# Patient Record
Sex: Female | Born: 1965 | Race: White | Hispanic: No | Smoking: Current every day smoker
Health system: Southern US, Community
[De-identification: ages and names within clinical notes are randomized; demographics above are authoritative.]

## PROBLEM LIST (undated history)

## (undated) DIAGNOSIS — M797 Fibromyalgia: Secondary | ICD-10-CM

## (undated) DIAGNOSIS — R519 Headache, unspecified: Secondary | ICD-10-CM

## (undated) DIAGNOSIS — F32A Depression, unspecified: Secondary | ICD-10-CM

## (undated) DIAGNOSIS — R251 Tremor, unspecified: Secondary | ICD-10-CM

## (undated) DIAGNOSIS — K219 Gastro-esophageal reflux disease without esophagitis: Secondary | ICD-10-CM

## (undated) DIAGNOSIS — G4730 Sleep apnea, unspecified: Secondary | ICD-10-CM

## (undated) DIAGNOSIS — F1120 Opioid dependence, uncomplicated: Secondary | ICD-10-CM

## (undated) DIAGNOSIS — F329 Major depressive disorder, single episode, unspecified: Secondary | ICD-10-CM

## (undated) DIAGNOSIS — M542 Cervicalgia: Secondary | ICD-10-CM

## (undated) DIAGNOSIS — G8929 Other chronic pain: Secondary | ICD-10-CM

## (undated) DIAGNOSIS — F419 Anxiety disorder, unspecified: Secondary | ICD-10-CM

## (undated) HISTORY — DX: Anxiety disorder, unspecified: F41.9

## (undated) HISTORY — PX: GALLBLADDER SURGERY: SHX652

## (undated) HISTORY — DX: Fibromyalgia: M79.7

## (undated) HISTORY — DX: Other chronic pain: G89.29

## (undated) HISTORY — DX: Depression, unspecified: F32.A

## (undated) HISTORY — DX: Opioid dependence, uncomplicated: F11.20

## (undated) HISTORY — DX: Cervicalgia: M54.2

## (undated) HISTORY — PX: ROTATOR CUFF REPAIR: SHX139

## (undated) HISTORY — DX: Major depressive disorder, single episode, unspecified: F32.9

## (undated) HISTORY — DX: Tremor, unspecified: R25.1

---

## 2000-11-21 HISTORY — PX: OTHER SURGICAL HISTORY: SHX169

## 2007-07-17 ENCOUNTER — Encounter: Admission: RE | Admit: 2007-07-17 | Discharge: 2007-07-17 | Payer: Self-pay | Admitting: Obstetrics and Gynecology

## 2007-07-19 ENCOUNTER — Other Ambulatory Visit: Admission: RE | Admit: 2007-07-19 | Discharge: 2007-07-19 | Payer: Self-pay | Admitting: Unknown Physician Specialty

## 2008-01-29 ENCOUNTER — Encounter: Admission: RE | Admit: 2008-01-29 | Discharge: 2008-01-29 | Payer: Self-pay | Admitting: Otolaryngology

## 2008-11-21 HISTORY — PX: OTHER SURGICAL HISTORY: SHX169

## 2009-03-16 ENCOUNTER — Ambulatory Visit (HOSPITAL_COMMUNITY): Admission: RE | Admit: 2009-03-16 | Discharge: 2009-03-16 | Payer: Self-pay | Admitting: Neurological Surgery

## 2010-07-08 ENCOUNTER — Encounter: Admission: RE | Admit: 2010-07-08 | Discharge: 2010-07-08 | Payer: Self-pay | Admitting: Gastroenterology

## 2010-07-14 ENCOUNTER — Encounter: Admission: RE | Admit: 2010-07-14 | Discharge: 2010-07-14 | Payer: Self-pay | Admitting: Obstetrics and Gynecology

## 2010-07-28 ENCOUNTER — Ambulatory Visit (HOSPITAL_COMMUNITY): Admission: RE | Admit: 2010-07-28 | Discharge: 2010-07-28 | Payer: Self-pay | Admitting: Gastroenterology

## 2010-08-04 ENCOUNTER — Ambulatory Visit (HOSPITAL_COMMUNITY): Admission: RE | Admit: 2010-08-04 | Discharge: 2010-08-04 | Payer: Self-pay | Admitting: Gastroenterology

## 2010-09-29 ENCOUNTER — Ambulatory Visit (HOSPITAL_COMMUNITY): Admission: RE | Admit: 2010-09-29 | Discharge: 2010-09-29 | Payer: Self-pay | Admitting: Gastroenterology

## 2011-03-02 LAB — CBC
HCT: 38.6 % (ref 36.0–46.0)
Hemoglobin: 13.3 g/dL (ref 12.0–15.0)
MCHC: 34.4 g/dL (ref 30.0–36.0)
MCV: 90.1 fL (ref 78.0–100.0)
Platelets: 273 10*3/uL (ref 150–400)
RBC: 4.29 MIL/uL (ref 3.87–5.11)
RDW: 13.2 % (ref 11.5–15.5)
WBC: 7.9 10*3/uL (ref 4.0–10.5)

## 2011-04-05 NOTE — Op Note (Signed)
[name], COLLAR               ACCOUNT NO.:  [id number]   MEDICAL RECORD NO.:  [id number]          PATIENT TYPE:  OIB   LOCATION:  3526                         FACILITY:  [facility]   PHYSICIAN:  Stefani Dama, M.D.  DATE OF BIRTH:  11-04-1966   DATE OF PROCEDURE:  03/16/2009  DATE OF DISCHARGE:  03/16/2009                               OPERATIVE REPORT   PREOPERATIVE DIAGNOSIS:  Cervical radiculopathy with spondylosis at C5-  C6.   POSTOPERATIVE DIAGNOSIS:  Cervical radiculopathy with spondylosis at C5-  C6.   PROCEDURE:  Anterior cervical decompression at C5-C6, arthrodesis with  structural allograft and Alphatec plate fixation at C5-C6.   SURGEON:  Stefani Dama, MD   FIRST ASSISTANT:  Clydene Fake, MD   ANESTHESIA:  General endotracheal.   INDICATIONS:  Nicole Bartlett is a 45 year old individual who has had  significant neck, shoulder, and arm pain with weakness in the biceps and  grip on the right side.  She has evidence of advanced spondylitic  changes with a central disk herniation causing biforaminal stenosis at  C5-C6.  She was treated conservatively but having failed this and  developing increasing pain and weakness in the right arm she has been  advised regarding surgical intervention.   PROCEDURE:  The patient was brought to the operating room supine on the  stretcher.  After smooth induction of general endotracheal anesthesia,  she was placed in 5 pounds of halter traction.  The neck was prepped  with alcohol and DuraPrep and draped in a sterile fashion.  Transverse  incision was made in left side of the neck and this was carried down  through the platysma.  The plane between the sternocleidomastoid and  strap muscles were dissected bluntly until the prevertebral space was  reached, first identifiable disk space was noted to be that at C3-C4  with a needle placed as far cephalad as possible.  Then after dissecting  to the space considered the C5-C6 space a  second final localizing  radiograph was obtained with the shoulders being pulled identifying the  space positively.  Anterior portion of the disk space was opened with an  osteophytectomy tool as there was a large ventral osteophyte noted in  the opening of C5-C6.  Once the osteophyte was removed, the disk  material underneath this could be dissected with combination of Kerrison  rongeurs and the disk space was fully evacuated.  As the region of  posterior longitudinal ligament was reached, the ligament was opened to  the left then to the right side and dissection was carried out to the  lateral recesses.  Once the lateral recesses were decompressed,  osteophytes from the uncinate process on the right side was removed to  allow full decompression of the nerve root on that right side.  Once  this was performed, hemostasis in the prevertebral space was obtained  and the interspace was sized for an appropriate size spacer and it was  felt that an 8-mm transgraft would fit appropriately and this was shaved  and formed to the appropriate size and configuration to fit into  the  interspace.  This was placed and tamped appropriately and then a 14-mm  standard size Alphatec was fitted to the ventral aspect of the vertebral  body of C5-C6 after traction was removed and the neck in slight flexion.  14-mm variable angle screws were placed to secure the plate between C5  and C6.  Final radiograph identified the top portion of the construct to  be in good position with the bone graft centered in the interspace.  With  this then hemostasis in the soft tissues was obtained meticulously and  the platysma was closed with 3-0 Vicryl in interrupted fashion, 3-0  Vicryl was then used to close the subcuticular skin and Dermabond was  placed on the skin.  The patient tolerated the procedure well was  returned to recovery room in stable condition.      Stefani Dama, M.D.  Electronically Signed      HJE/MEDQ  D:  03/16/2009  T:  03/17/2009  Job:  [id number]

## 2011-09-26 ENCOUNTER — Other Ambulatory Visit: Payer: Self-pay | Admitting: Anesthesiology

## 2011-09-26 DIAGNOSIS — M25511 Pain in right shoulder: Secondary | ICD-10-CM

## 2011-09-27 ENCOUNTER — Other Ambulatory Visit: Payer: Self-pay

## 2012-08-15 DIAGNOSIS — N912 Amenorrhea, unspecified: Secondary | ICD-10-CM | POA: Insufficient documentation

## 2014-01-07 ENCOUNTER — Other Ambulatory Visit: Payer: Self-pay

## 2014-01-07 MED ORDER — TOPIRAMATE 25 MG PO TABS: 50.0000 mg | ORAL_TABLET | Freq: Two times a day (BID) | ORAL | Status: DC

## 2014-02-24 ENCOUNTER — Telehealth: Payer: Self-pay | Admitting: Nurse Practitioner

## 2014-02-24 MED ORDER — TOPIRAMATE 25 MG PO TABS: 50.0000 mg | ORAL_TABLET | Freq: Two times a day (BID) | ORAL | Status: DC

## 2014-02-24 NOTE — Telephone Encounter (Signed)
Rx has been sent  

## 2014-02-24 NOTE — Telephone Encounter (Signed)
Pt needs a refill on her topiramate (TOPAMAX) 25 MG tablet.  She has an appointment scheduled for 03-06-14.  Thank you.

## 2014-03-05 ENCOUNTER — Encounter: Payer: Self-pay | Admitting: Nurse Practitioner

## 2014-03-06 ENCOUNTER — Encounter: Payer: Self-pay | Admitting: Nurse Practitioner

## 2014-03-06 ENCOUNTER — Encounter (INDEPENDENT_AMBULATORY_CARE_PROVIDER_SITE_OTHER): Payer: Self-pay

## 2014-03-06 ENCOUNTER — Ambulatory Visit (INDEPENDENT_AMBULATORY_CARE_PROVIDER_SITE_OTHER): Payer: BC Managed Care – PPO | Admitting: Nurse Practitioner

## 2014-03-06 VITALS — BP 113/83 | HR 78 | Ht 65.0 in | Wt 215.0 lb

## 2014-03-06 DIAGNOSIS — G43019 Migraine without aura, intractable, without status migrainosus: Secondary | ICD-10-CM | POA: Insufficient documentation

## 2014-03-06 DIAGNOSIS — R51 Headache: Secondary | ICD-10-CM

## 2014-03-06 DIAGNOSIS — R519 Headache, unspecified: Secondary | ICD-10-CM | POA: Insufficient documentation

## 2014-03-06 MED ORDER — TOPIRAMATE 25 MG PO TABS: 50.0000 mg | ORAL_TABLET | Freq: Two times a day (BID) | ORAL | Status: DC

## 2014-03-06 MED ORDER — RIZATRIPTAN BENZOATE 10 MG PO TABS: 10.0000 mg | ORAL_TABLET | ORAL | Status: DC | PRN

## 2014-03-06 NOTE — Progress Notes (Signed)
[facility]  PATIENT: Nicole Bartlett DOB: May 19, 1966   REASON FOR VISIT: Followup for migraines   HISTORY OF PRESENT ILLNESS: Ms. Atchley, 48 year old female returns for followup she has a long history of headaches for more than 20 years. She is currently on Topamax 50 twice daily with less intense headaches. She takes Maxalt acutely. Overall her headaches have decreased by 50-75%. She also has a history of chronic pain and is seeing  pain management Dr. Hardin Negus. She returns for reevaluation. She has had no new medical problems since last seen.   HISTORY:  HAs occurring daily for almost 20 yrs- usually in back or front, dull, aching, not lateralizing by Dr. Doy Mince on 03/30/10.   She has taken APAP and ibuprofen for HAs in past.  Now on  Topamax 50mg  twice daily. Headaches less intense, throb, no nausea, light or noise sensitivity and have decreased by 50%. HAs more intense w/ menses- rarely would wake at night- no assoc visual sx, dizziness, numbness.h/o neck fusion x2 (C6-7 2002, C5-6 2010)- She goes to the pain clinic for chronic back pain. She takes Maxalt rarely but it works acutely.    REVIEW OF SYSTEMS: Full 14 system review of systems performed and notable only for those listed, all others are neg:  Constitutional: N/A  Cardiovascular: N/A  Ear/Nose/Throat: Ringing in the ears  Skin: N/A  Eyes: N/A  Respiratory: N/A  Gastroitestinal: N/A  Hematology/Lymphatic: N/A  Endocrine: N/A Musculoskeletal: Joint pain, back pain Allergy/Immunology: N/A  Neurological: Headache  Psychiatric: Anxiety Sleep frequent awakenings, sleep study in the past negative   ALLERGIES: No Known Allergies  HOME MEDICATIONS: Outpatient Prescriptions Prior to Visit  Medication Sig Dispense Refill  . buPROPion (WELLBUTRIN SR) 200 MG 12 hr tablet Take 200 mg by mouth 2 (two) times daily.      Marland Kitchen oxyCODONE (ROXICODONE) 15 MG immediate release tablet Take 10 mg by mouth every 4  (four) hours as needed for pain (as needed).       . rizatriptan (MAXALT) 10 MG tablet Take 10 mg by mouth as needed for migraine. May repeat in 2 hours if needed      . tiZANidine (ZANAFLEX) 4 MG capsule Take 4 mg by mouth 3 (three) times daily.      Marland Kitchen topiramate (TOPAMAX) 25 MG tablet Take 2 tablets (50 mg total) by mouth 2 (two) times daily.  120 tablet  0  . amitriptyline (ELAVIL) 10 MG tablet Take 10 mg by mouth at bedtime. 2 tabs at bedtime      . citalopram (CELEXA) 20 MG tablet Take 20 mg by mouth daily.      Marland Kitchen doxycycline (VIBRAMYCIN) 100 MG capsule Take 100 mg by mouth 2 (two) times daily.       No facility-administered medications prior to visit.    PAST MEDICAL HISTORY: Past Medical History  Diagnosis Date  . Anxiety and depression   . Fibromyalgia   . Chronic neck pain   . Narcotic dependence     PAST SURGICAL HISTORY: Past Surgical History  Procedure Laterality Date  . C5/c7 fusion  2002  . C5/c6 fusion  2010  . Rotator cuff repair Bilateral   . Gallbladder surgery    . Cesarean section      times 2    FAMILY HISTORY: Family History  Problem Relation Age of Onset  . Cancer Mother   . Diabetes Father   . Migraines Sister     SOCIAL HISTORY: History  Social History  . Marital Status: Legally Separated    Spouse Name: N/A    Number of Children: 2  . Years of Education: N/A   Occupational History  . [employer] History Main Topics  . Smoking status: Current Every Day Smoker  . Smokeless tobacco: Never Used  . Alcohol Use: Yes     Comment: 1 daily  . Drug Use: No  . Sexual Activity: Not on file   Other Topics Concern  . Not on file   Social History Narrative   Patient is separated.   Patient has 2 children   Patient lives at home with daughter.   Patient drinks caffeine everyday until 5pm.              PHYSICAL EXAM  Filed Vitals:   03/06/14 1106  BP: 113/83  Pulse: 78  Height: 5\' 5"  (1.651 m)  Weight: 215 lb  (97.523 kg)   Body mass index is 35.78 kg/(m^2).  Generalized: Well developed, obese female in no acute distress  Head: normocephalic and atraumatic,. Oropharynx benign  Neck: Supple, no carotid bruits  Cardiac: Regular rate rhythm, no murmur  Musculoskeletal: No deformity   Neurological examination   Mentation: Alert oriented to time, place, history taking. Follows all commands speech and language fluent  Cranial nerve II-XII: Pupils were equal round reactive to light extraocular movements were full, visual field were full on confrontational test. Facial sensation and strength were normal. hearing was intact to finger rubbing bilaterally. Uvula tongue midline. head turning and shoulder shrug were normal and symmetric.Tongue protrusion into cheek strength was normal. Motor: normal bulk and tone, full strength in the BUE, BLE, fine finger movements normal, no pronator drift. No focal weakness Sensory: normal and symmetric to light touch, pinprick, and  vibration  Coordination: finger-nose-finger, heel-to-shin bilaterally, no dysmetria Reflexes: Brachioradialis 2/2, biceps 2/2, triceps 2/2, patellar 2/2, Achilles 2/2, plantar responses were flexor bilaterally. Gait and Station: Rising up from seated position without assistance, normal stance,  moderate stride, good arm swing, smooth turning, able to perform tiptoe, and heel walking without difficulty. Tandem gait is steady  DIAGNOSTIC DATA (LABS, IMAGING, TESTING) - ASSESSMENT AND PLAN  48 y.o. year old female  has a past medical history of Anxiety and depression; Fibromyalgia; Chronic neck pain; and Narcotic dependence. She has had headaches for 20 years which are currently well controlled on Topamax.  Continue Topamax and Maxalt as ordered Patient was given written prescriptions her request Followup yearly and when necessary Dennie Bible, [facility], [facility], APRN  [facility] [address] [place], [place]  [address] [phone]

## 2014-03-06 NOTE — Patient Instructions (Signed)
Continue Topamax and Maxalt as ordered Patient was given written prescriptions her request Followup yearly and when necessary

## 2014-03-06 NOTE — Progress Notes (Signed)
I have read the note, and I agree with the clinical assessment and plan.  Nicole Bartlett K Nicole Bartlett   

## 2014-08-08 ENCOUNTER — Ambulatory Visit: Payer: Self-pay | Admitting: Medical

## 2014-09-22 DIAGNOSIS — F339 Major depressive disorder, recurrent, unspecified: Secondary | ICD-10-CM | POA: Insufficient documentation

## 2014-09-22 DIAGNOSIS — G894 Chronic pain syndrome: Secondary | ICD-10-CM | POA: Insufficient documentation

## 2015-02-06 DIAGNOSIS — Z720 Tobacco use: Secondary | ICD-10-CM | POA: Insufficient documentation

## 2015-02-06 DIAGNOSIS — G4710 Hypersomnia, unspecified: Secondary | ICD-10-CM | POA: Insufficient documentation

## 2015-02-06 DIAGNOSIS — E669 Obesity, unspecified: Secondary | ICD-10-CM | POA: Insufficient documentation

## 2015-02-06 DIAGNOSIS — R0683 Snoring: Secondary | ICD-10-CM | POA: Insufficient documentation

## 2015-02-06 DIAGNOSIS — R5383 Other fatigue: Secondary | ICD-10-CM | POA: Insufficient documentation

## 2015-03-09 ENCOUNTER — Ambulatory Visit: Payer: BC Managed Care – PPO | Admitting: Nurse Practitioner

## 2015-03-27 ENCOUNTER — Other Ambulatory Visit: Payer: Self-pay | Admitting: Anesthesiology

## 2015-03-27 ENCOUNTER — Other Ambulatory Visit: Payer: Self-pay

## 2015-03-27 DIAGNOSIS — M542 Cervicalgia: Secondary | ICD-10-CM

## 2015-03-27 MED ORDER — TOPIRAMATE 25 MG PO TABS: 50.0000 mg | ORAL_TABLET | Freq: Two times a day (BID) | ORAL | Status: DC

## 2015-03-27 NOTE — Telephone Encounter (Signed)
Patient has appt scheduled

## 2015-04-06 ENCOUNTER — Ambulatory Visit
Admission: RE | Admit: 2015-04-06 | Discharge: 2015-04-06 | Disposition: A | Discharge status: Home / Self Care | Payer: BLUE CROSS/BLUE SHIELD | Source: Ambulatory Visit | Attending: Anesthesiology | Admitting: Anesthesiology

## 2015-04-06 DIAGNOSIS — M542 Cervicalgia: Secondary | ICD-10-CM

## 2015-04-06 MED ORDER — GADOBENATE DIMEGLUMINE 529 MG/ML IV SOLN
20.0000 mL | Freq: Once | INTRAVENOUS | Status: AC | PRN
  Administered 2015-04-06: 20 mL via INTRAVENOUS

## 2015-04-08 ENCOUNTER — Encounter: Payer: Self-pay | Admitting: Nurse Practitioner

## 2015-04-08 ENCOUNTER — Ambulatory Visit (INDEPENDENT_AMBULATORY_CARE_PROVIDER_SITE_OTHER): Payer: BLUE CROSS/BLUE SHIELD | Admitting: Nurse Practitioner

## 2015-04-08 VITALS — BP 132/85 | HR 106 | Ht 64.0 in | Wt 228.0 lb

## 2015-04-08 DIAGNOSIS — R51 Headache: Secondary | ICD-10-CM

## 2015-04-08 DIAGNOSIS — G43019 Migraine without aura, intractable, without status migrainosus: Secondary | ICD-10-CM

## 2015-04-08 DIAGNOSIS — R519 Headache, unspecified: Secondary | ICD-10-CM

## 2015-04-08 MED ORDER — RIZATRIPTAN BENZOATE 10 MG PO TABS: 10.0000 mg | ORAL_TABLET | ORAL | Status: DC | PRN

## 2015-04-08 MED ORDER — TOPIRAMATE 25 MG PO TABS: 50.0000 mg | ORAL_TABLET | Freq: Two times a day (BID) | ORAL | Status: DC

## 2015-04-08 NOTE — Progress Notes (Signed)
[facility]  PATIENT: Nicole Bartlett DOB: 01/18/1966   REASON FOR VISIT: follow up for migraine HISTORY FROM:patient    HISTORY OF PRESENT ILLNESS:Nicole Bartlett, 49 year old female returns for followup, she has a long history of headaches for more than 21 years. She was last seen in the office 03/06/2014. Since that time she has been diagnosed with obstructive sleep apnea and is on BiPAP for a week now. She is also had increase in her neck pain and had recent MRI of the neck. She is to get those results this week. She is currently on Topamax 50 twice daily with less intense headaches. She takes Maxalt acutely. Overall her headaches have increased slightly however she thinks this is due to her neck pain. She also has a history of chronic pain and is seeing pain management Dr. Hardin Negus. She has major depression and sees Dr. Jordan Hawks, she has had numerous changes to her medications since last seen. She returns for reevaluation.  HISTORY: HAs occurring daily for almost 20 yrs- usually in back or front, dull, aching, not lateralizing by Dr. Doy Mince on 03/30/10. She has taken APAP and ibuprofen for HAs in past. Now on Topamax 50mg  twice daily. Headaches less intense, throb, no nausea, light or noise sensitivity and have decreased by 50%. HAs more intense w/ menses- rarely would wake at night- no assoc visual sx, dizziness, numbness.h/o neck fusion x2 (C6-7 2002, C5-6 2010)- She goes to the pain clinic for chronic back pain. She takes Maxalt rarely but it works acutely.    REVIEW OF SYSTEMS: Full 14 system review of systems performed and notable only for those listed, all others are neg:  Constitutional: Fatigue Cardiovascular: Leg swelling  Ear/Nose/Throat: neg  Skin: neg Eyes: neg Respiratory: neg Gastroitestinal: neg  Hematology/Lymphatic: Easy bruising Endocrine: neg Musculoskeletal: Back pain neck pain, neck stiffness  Allergy/Immunology: neg Neurological: Headache,  tremors  Psychiatric: Major depression, anxiety Sleep : Obstructive sleep apnea with BiPAP   ALLERGIES: Allergies  Allergen Reactions  . Bupropion Hives    Something in the compound drug.      HOME MEDICATIONS: Outpatient Prescriptions Prior to Visit  Medication Sig Dispense Refill  . buPROPion (WELLBUTRIN SR) 200 MG 12 hr tablet Take 200 mg by mouth 2 (two) times daily.    . OxyCODONE HCl ER (OXYCONTIN) 30 MG T12A Take 30 mg by mouth 2 (two) times daily.    . rizatriptan (MAXALT) 10 MG tablet Take 1 tablet (10 mg total) by mouth as needed for migraine. May repeat in 2 hours if needed 10 tablet 11  . topiramate (TOPAMAX) 25 MG tablet Take 2 tablets (50 mg total) by mouth 2 (two) times daily. 120 tablet 0  . oxyCODONE (ROXICODONE) 15 MG immediate release tablet Take 10 mg by mouth every 4 (four) hours as needed for pain (as needed).     Marland Kitchen tiZANidine (ZANAFLEX) 4 MG capsule Take 4 mg by mouth 3 (three) times daily.     No facility-administered medications prior to visit.    PAST MEDICAL HISTORY: Past Medical History  Diagnosis Date  . Anxiety and depression   . Fibromyalgia   . Chronic neck pain   . Narcotic dependence   . Tremor     PAST SURGICAL HISTORY: Past Surgical History  Procedure Laterality Date  . C5/c7 fusion  2002  . C5/c6 fusion  2010  . Rotator cuff repair Bilateral   . Gallbladder surgery    . Cesarean section  times 2    FAMILY HISTORY: Family History  Problem Relation Age of Onset  . Cancer Mother   . Diabetes Father   . Migraines Sister     SOCIAL HISTORY: History   Social History  . Marital Status: Legally Separated    Spouse Name: N/A  . Number of Children: 2  . Years of Education: N/A   Occupational History  . [employer] History Main Topics  . Smoking status: Current Every Day Smoker -- 1.00 packs/day    Types: Cigarettes  . Smokeless tobacco: Never Used  . Alcohol Use: 0.0 oz/week    0 Standard drinks or  equivalent per week     Comment: rare  . Drug Use: No  . Sexual Activity: Not on file   Other Topics Concern  . Not on file   Social History Narrative   Patient is separated.   Patient has 2 children   Patient lives at home with daughter.   Patient drinks caffeine everyday until 5pm.              PHYSICAL EXAM  Filed Vitals:   04/08/15 0759  BP: 132/85  Pulse: 106  Height: 5\' 4"  (1.626 m)  Weight: 228 lb (103.42 kg)   Body mass index is 39.12 kg/(m^2). Generalized: Well developed, obese female in no acute distress  Head: normocephalic and atraumatic,. Oropharynx benign  Neck: Supple, no carotid bruits , mild decreased range of motion left and right Cardiac: Regular rate rhythm, no murmur  Musculoskeletal: No deformity   Neurological examination   Mentation: Alert oriented to time, place, history taking. Follows all commands speech and language fluent  Cranial nerve II-XII: Pupils were equal round reactive to light extraocular movements were full, visual field were full on confrontational test. Facial sensation and strength were normal. hearing was intact to finger rubbing bilaterally. Uvula tongue midline. head turning and shoulder shrug were normal and symmetric.Tongue protrusion into cheek strength was normal. Motor: normal bulk and tone, full strength in the BUE, BLE, fine finger movements normal, no pronator drift. No focal weakness Coordination: finger-nose-finger, heel-to-shin bilaterally, no dysmetria Reflexes: Brachioradialis 2/2, biceps 2/2, triceps 2/2, patellar 2/2, Achilles 2/2, plantar responses were flexor bilaterally. Gait and Station: Rising up from seated position without assistance, normal stance, moderate stride, good arm swing, smooth turning, able to perform tiptoe, and heel walking without difficulty. Tandem gait is steady DIAGNOSTIC DATA (LABS, IMAGING, TESTING)  ASSESSMENT AND PLAN  49 y.o. year old female  has a past medical history of  Anxiety and depression; Fibromyalgia; Chronic neck pain; Narcotic dependence; and headaches for over 21 years.  Continue Topamax at current dose will refill Continue Maxalt at current dose will refill Follow-up in 6 months Dennie Bible, [facility], [facility], APRN  [facility] [address] [place], [place] [address] [phone]

## 2015-04-08 NOTE — Progress Notes (Signed)
I have read the note, and I agree with the clinical assessment and plan.  Nicole Bartlett,Nicole Bartlett   

## 2015-04-08 NOTE — Patient Instructions (Signed)
Continue Topamax at current dose will refill Continue Maxalt at current dose will refill Follow-up in 6 months

## 2015-05-08 ENCOUNTER — Other Ambulatory Visit: Payer: Self-pay | Admitting: Neurological Surgery

## 2015-05-08 DIAGNOSIS — R251 Tremor, unspecified: Secondary | ICD-10-CM

## 2015-05-16 ENCOUNTER — Ambulatory Visit
Admission: RE | Admit: 2015-05-16 | Discharge: 2015-05-16 | Disposition: A | Discharge status: Home / Self Care | Payer: BLUE CROSS/BLUE SHIELD | Source: Ambulatory Visit | Attending: Neurological Surgery | Admitting: Neurological Surgery

## 2015-05-16 DIAGNOSIS — R251 Tremor, unspecified: Secondary | ICD-10-CM

## 2015-07-01 ENCOUNTER — Ambulatory Visit (INDEPENDENT_AMBULATORY_CARE_PROVIDER_SITE_OTHER): Payer: BLUE CROSS/BLUE SHIELD | Admitting: Nurse Practitioner

## 2015-07-01 ENCOUNTER — Encounter: Payer: Self-pay | Admitting: Nurse Practitioner

## 2015-07-01 VITALS — BP 122/83 | HR 98 | Ht 63.5 in | Wt 232.0 lb

## 2015-07-01 DIAGNOSIS — G43019 Migraine without aura, intractable, without status migrainosus: Secondary | ICD-10-CM | POA: Diagnosis not present

## 2015-07-01 NOTE — Progress Notes (Signed)
[facility]  PATIENT: Nicole Bartlett DOB: 1966-07-15   REASON FOR VISIT: Follow-up for migraine history of neck pain and obstructive sleep apnea HISTORY FROM: Patient    HISTORY OF PRESENT ILLNESS:Nicole Bartlett, 49 year old female returns for followup, she has a long history of headaches for more than 21 years.  Since last seen  she has been diagnosed with obstructive sleep apnea and is on BiPAP for 2 months. She is also had increase in her neck pain and had recent MRI of the neck 04/07/15  with impression : Prior fusion C5-6 and C6-7 with mild reversal of the normal cervical lordosis.No significant cervical spinal stenosis or foraminal narrowing.She also had an MRI of the brain 05/16/15 ordered by Dr. Ellene Route which was normal . She is currently on Topamax 100mg  twice daily with less intense headaches. She takes Maxalt acutely. Overall her headaches have increased slightly however she thinks this is due to her neck pain.She has history of chronic pain and is seeing pain management Dr. Hardin Negus. She has major depression and sees Dr. Jordan Hawks. She returns for reevaluation.  HISTORY: HAs occurring daily for almost 20 yrs- usually in back or front, dull, aching, not lateralizing by Dr. Doy Mince on 03/30/10. She has taken APAP and ibuprofen for HAs in past. Now on Topamax 50mg  twice daily. Headaches less intense, throb, no nausea, light or noise sensitivity and have decreased by 50%. HAs more intense w/ menses- rarely would wake at night- no assoc visual sx, dizziness, numbness.h/o neck fusion x2 (C6-7 2002, C5-6 2010)- She goes to the pain clinic for chronic back pain. She takes Maxalt rarely but it works acutely.     REVIEW OF SYSTEMS: Full 14 system review of systems performed and notable only for those listed, all others are neg:  Constitutional: Fatigue Cardiovascular: Leg swelling Ear/Nose/Throat: neg  Skin: neg Eyes: Light sensitivity Respiratory: neg Gastroitestinal:  neg  Hematology/Lymphatic: neg  Endocrine: Intolerance to heat Musculoskeletal: Joint pain joint swelling, back pain aching muscles Pain neck stiffness seen by pain clinic Allergy/Immunology: neg Neurological: neg Psychiatric depression and anxiety seen by psychiatry Sleep : Obstructive sleep apnea, daytime drowsiness   ALLERGIES: Allergies  Allergen Reactions  . Bupropion Hives    Something in the compound drug.      HOME MEDICATIONS: Outpatient Prescriptions Prior to Visit  Medication Sig Dispense Refill  . buPROPion (WELLBUTRIN SR) 200 MG 12 hr tablet Take 200 mg by mouth 2 (two) times daily.    . Cholecalciferol (VITAMIN D) 2000 UNITS tablet Take 2,000 Units by mouth as needed.     . clonazePAM (KLONOPIN) 1 MG tablet Take 1 mg by mouth.    . diclofenac sodium (VOLTAREN) 1 % GEL PLACE 4 G ONTO THE SKIN 4 (FOUR) TIMES DAILY.    Marland Kitchen dicyclomine (BENTYL) 20 MG tablet   11  . DULoxetine (CYMBALTA) 60 MG capsule Take 60 mg by mouth.    . methocarbamol (ROBAXIN) 500 MG tablet Take 500 mg by mouth.    . Oxycodone HCl 10 MG TABS Take 1 tablet by mouth.    . pantoprazole (PROTONIX) 40 MG tablet Take 40 mg by mouth daily as needed.   5  . rizatriptan (MAXALT) 10 MG tablet Take 1 tablet (10 mg total) by mouth as needed for migraine. May repeat in 2 hours if needed 10 tablet 11  . topiramate (TOPAMAX) 25 MG tablet Take 2 tablets (50 mg total) by mouth 2 (two) times daily. 120 tablet 6  .  doxycycline (VIBRA-TABS) 100 MG tablet Take 100 mg by mouth daily.     . OxyCODONE HCl ER (OXYCONTIN) 30 MG T12A Take 30 mg by mouth 2 (two) times daily.     No facility-administered medications prior to visit.    PAST MEDICAL HISTORY: Past Medical History  Diagnosis Date  . Anxiety and depression   . Fibromyalgia   . Chronic neck pain   . Narcotic dependence   . Tremor     PAST SURGICAL HISTORY: Past Surgical History  Procedure Laterality Date  . C5/c7 fusion  2002  . C5/c6 fusion  2010  .  Rotator cuff repair Bilateral   . Gallbladder surgery    . Cesarean section      times 2    FAMILY HISTORY: Family History  Problem Relation Age of Onset  . Cancer Mother   . Diabetes Father   . Migraines Sister     SOCIAL HISTORY: Social History   Social History  . Marital Status: Legally Separated    Spouse Name: N/A  . Number of Children: 2  . Years of Education: N/A   Occupational History  . [employer] History Main Topics  . Smoking status: Current Every Day Smoker -- 1.00 packs/day    Types: Cigarettes  . Smokeless tobacco: Never Used  . Alcohol Use: 0.0 oz/week    0 Standard drinks or equivalent per week     Comment: rare  . Drug Use: No  . Sexual Activity: Not on file   Other Topics Concern  . Not on file   Social History Narrative   Patient is separated.   Patient has 2 children   Patient lives at home with daughter.   Patient drinks caffeine everyday until 5pm.              PHYSICAL EXAM  Filed Vitals:   07/01/15 0802  BP: 122/83  Pulse: 98  Height: 5' 3.5" (1.613 m)  Weight: 232 lb (105.235 kg)   Body mass index is 40.45 kg/(m^2). Generalized: Well developed, obese female in no acute distress  Head: normocephalic and atraumatic,. Oropharynx benign  Neck: Supple, no carotid bruits , mild decreased range of motion left and right Cardiac: Regular rate rhythm, no murmur  Musculoskeletal: No deformity   Neurological examination   Mentation: Alert oriented to time, place, history taking. Follows all commands speech and language fluent  Cranial nerve II-XII: Visual acuity 20/70 right, 20100 left with corrected lenses Pupils were equal round reactive to light extraocular movements were full, visual field were full on confrontational test. Facial sensation and strength were normal. hearing was intact to finger rubbing bilaterally. Uvula tongue midline. head turning and shoulder shrug were normal and symmetric.Tongue  protrusion into cheek strength was normal. Motor: normal bulk and tone, full strength in the BUE, BLE, fine finger movements normal, no pronator drift. No focal weakness Coordination: finger-nose-finger, heel-to-shin bilaterally, no dysmetria Reflexes: Brachioradialis 2/2, biceps 2/2, triceps 2/2, patellar 2/2, Achilles 2/2, plantar responses were flexor bilaterally. Gait and Station: Rising up from seated position without assistance, normal stance, moderate stride, good arm swing, smooth turning, able to perform tiptoe, and heel walking without difficulty. Tandem gait is steady  DIAGNOSTIC DATA (LABS, IMAGING, TESTING) 04/07/15 MRI of the neck with impression : Prior fusion C5-6 and C6-7 with mild reversal of the normal cervical lordosis.No significant cervical spinal stenosis or foraminal narrowing. 05/16/15 MRI of the brain ordered by Dr. Ellene Route  was normal- ASSESSMENT  AND PLAN  49 y.o. year old female  has a past medical history of Anxiety and depression; Fibromyalgia; Chronic neck pain; Narcotic dependence; and headaches for over 21 years. Essential tremor and obstructive sleep apnea here to follow-up.  Continue Topamax at current dose will refill Continue Maxalt at current dose will refill I spent additional 15 in total face to face time with the patient more than 50% of which was spent counseling and coordination of care, reviewing test results reviewing medications and discussing and reviewing the diagnosis of migraine , triggers, foods to avoid weather changes and other triggers.  Given a copy of common triggers If headaches worsen she is to keep a diary. Follow-up in 6 months Nicole Bartlett, [facility], [facility], APRN  [address] [facility] [address], [place], [place] [address] [phone]

## 2015-07-01 NOTE — Progress Notes (Signed)
I have read the note, and I agree with the clinical assessment and plan.  Corliss Coggeshall KEITH   

## 2015-07-01 NOTE — Patient Instructions (Signed)
Continue Topamax at current dose will refill Continue Maxalt at current dose will refill Discussed migraine triggers Follow-up in 6 months

## 2015-10-09 ENCOUNTER — Ambulatory Visit: Payer: BLUE CROSS/BLUE SHIELD | Admitting: Nurse Practitioner

## 2015-10-14 ENCOUNTER — Ambulatory Visit: Payer: BLUE CROSS/BLUE SHIELD | Admitting: Nurse Practitioner

## 2015-12-16 ENCOUNTER — Other Ambulatory Visit: Payer: Self-pay | Admitting: Nurse Practitioner

## 2015-12-16 NOTE — Telephone Encounter (Signed)
LMVM for pt to return call if needs medication refilled prior to her appt 01-06-16 otherwise I will disregard request.

## 2015-12-29 ENCOUNTER — Ambulatory Visit (INDEPENDENT_AMBULATORY_CARE_PROVIDER_SITE_OTHER): Payer: BLUE CROSS/BLUE SHIELD | Admitting: Nurse Practitioner

## 2015-12-29 ENCOUNTER — Encounter: Payer: Self-pay | Admitting: Nurse Practitioner

## 2015-12-29 VITALS — BP 122/78 | HR 100 | Ht 63.5 in | Wt 228.4 lb

## 2015-12-29 DIAGNOSIS — G894 Chronic pain syndrome: Secondary | ICD-10-CM | POA: Diagnosis not present

## 2015-12-29 DIAGNOSIS — R51 Headache: Secondary | ICD-10-CM

## 2015-12-29 DIAGNOSIS — G43909 Migraine, unspecified, not intractable, without status migrainosus: Secondary | ICD-10-CM

## 2015-12-29 DIAGNOSIS — R519 Headache, unspecified: Secondary | ICD-10-CM

## 2015-12-29 MED ORDER — RIZATRIPTAN BENZOATE 10 MG PO TABS: 10.0000 mg | ORAL_TABLET | ORAL | Status: DC | PRN

## 2015-12-29 MED ORDER — TOPIRAMATE 25 MG PO TABS: ORAL_TABLET | ORAL | Status: DC

## 2015-12-29 NOTE — Progress Notes (Signed)
I have read the note, and I agree with the clinical assessment and plan.  Kinsly Hild KEITH   

## 2015-12-29 NOTE — Progress Notes (Signed)
[facility]  PATIENT: Nicole Bartlett DOB: 1966/03/06   REASON FOR VISIT: Follow-up for chronic headache/migraine, obstructive sleep apnea HISTORY FROM: Patient    HISTORY OF PRESENT ILLNESS:[name], 50 year old female returns for followup, she has a long history of headaches for more than 22 years. She was diagnosed with obstructive sleep apnea and is on BiPAP her mask  does not fit and she has not followed up with her equipment company. Her sleep physician is in [place] . She has chronic neck pain and most recent MRI of the neck without significant cervical spinal stenosis or foraminal narrowing.She also had an MRI of the brain 05/16/15 ordered by Dr. Ellene Route which was normal . She is currently on Topamax 50mg  twice daily with increase in headaches during tax season. 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 She has history of chronic pain and is seeing pain management Dr. Hardin Negus. She has major depression and sees Dr. Jordan Hawks. She returns for reevaluation.  HISTORY: HAs occurring daily for almost 20 yrs- usually in back or front, dull, aching, not lateralizing by Dr. Doy Mince on 03/30/10. She has taken APAP and ibuprofen for HAs in past. Now on Topamax 50mg  twice daily. Headaches less intense, throb, no nausea, light or noise sensitivity and have decreased by 50%. HAs more intense w/ menses- rarely would wake at night- no assoc visual sx, dizziness, numbness.h/o neck fusion x2 (C6-7 2002, C5-6 2010)- She goes to the pain clinic for chronic back pain. She takes Maxalt rarely but it works acutely.    REVIEW OF SYSTEMS: Full 14 system review of systems performed and notable only for those listed, all others are neg:  Constitutional: Fatigue Cardiovascular: neg Ear/Nose/Throat: neg  Skin: neg Eyes: Light sensitivity Respiratory: neg Gastroitestinal: neg  Hematology/Lymphatic: neg  Endocrine: Intolerance to heat Musculoskeletal: Joint pain neck pain and back  pain Allergy/Immunology: neg Neurological: Headaches Psychiatric: neg Sleep : Obstructive sleep apnea not using BiPAP   ALLERGIES: Allergies  Allergen Reactions  . Bupropion Hives    Something in the compound drug.      HOME MEDICATIONS: Outpatient Prescriptions Prior to Visit  Medication Sig Dispense Refill  . buPROPion (WELLBUTRIN SR) 200 MG 12 hr tablet Take 200 mg by mouth 2 (two) times daily.    . clonazePAM (KLONOPIN) 1 MG tablet Take 1 mg by mouth.    . DULoxetine (CYMBALTA) 60 MG capsule Take 60 mg by mouth.    . methocarbamol (ROBAXIN) 500 MG tablet Take 500 mg by mouth 4 (four) times daily.     . Oxycodone HCl 10 MG TABS Take 1 tablet by mouth 3 (three) times daily.     . pantoprazole (PROTONIX) 40 MG tablet Take 40 mg by mouth daily as needed.   5  . rizatriptan (MAXALT) 10 MG tablet Take 1 tablet (10 mg total) by mouth as needed for migraine. May repeat in 2 hours if needed 10 tablet 11  . topiramate (TOPAMAX) 25 MG tablet TAKE 2 TABLETS (50 MG TOTAL) BY MOUTH 2 (TWO) TIMES DAILY. 120 tablet 0  . Cholecalciferol (VITAMIN D) 2000 UNITS tablet Take 2,000 Units by mouth as needed. Reported on 12/29/2015    . diclofenac sodium (VOLTAREN) 1 % GEL PLACE 4 G ONTO THE SKIN 4 (FOUR) TIMES DAILY.    Marland Kitchen dicyclomine (BENTYL) 20 MG tablet Reported on 12/29/2015  11  . doxycycline (VIBRA-TABS) 100 MG tablet Take 100 mg by mouth daily. Reported on 12/29/2015    .  morphine (MS CONTIN) 30 MG 12 hr tablet Take 30 mg by mouth every 12 (twelve) hours. Reported on 12/29/2015  0   No facility-administered medications prior to visit.    PAST MEDICAL HISTORY: Past Medical History  Diagnosis Date  . Anxiety and depression   . Fibromyalgia   . Chronic neck pain   . Narcotic dependence ([place])   . Tremor     PAST SURGICAL HISTORY: Past Surgical History  Procedure Laterality Date  . C5/c7 fusion  2002  . C5/c6 fusion  2010  . Rotator cuff repair Bilateral   . Gallbladder surgery    . Cesarean  section      times 2    FAMILY HISTORY: Family History  Problem Relation Age of Onset  . Cancer Mother   . Diabetes Father   . Migraines Sister     SOCIAL HISTORY: Social History   Social History  . Marital Status: Legally Separated    Spouse Name: N/A  . Number of Children: 2  . Years of Education: N/A   Occupational History  . [place] History Main Topics  . Smoking status: Current Every Day Smoker -- 1.00 packs/day    Types: Cigarettes  . Smokeless tobacco: Never Used  . Alcohol Use: 0.0 oz/week    0 Standard drinks or equivalent per week     Comment: rare  . Drug Use: No  . Sexual Activity: Not on file   Other Topics Concern  . Not on file   Social History Narrative   Patient is separated.   Patient has 2 children   Patient lives at home with daughter.   Patient drinks caffeine everyday until 5pm.              PHYSICAL EXAM  Filed Vitals:   12/29/15 0822  BP: 122/78  Pulse: 100  Height: 5' 3.5" (1.613 m)  Weight: 228 lb 6.4 oz (103.602 kg)   Body mass index is 39.82 kg/(m^2). Generalized: Well developed, obese female in no acute distress  Head: normocephalic and atraumatic,. Oropharynx benign  Neck: Supple, no carotid bruits , mild decreased range of motion left and right Cardiac: Regular rate rhythm, no murmur  Musculoskeletal: No deformity   Neurological examination   Mentation: Alert oriented to time, place, history taking. Follows all commands speech and language fluent  Cranial nerve II-XII: Pupils were equal round reactive to light extraocular movements were full, visual field were full on confrontational test. Facial sensation and strength were normal. hearing was intact to finger rubbing bilaterally. Uvula tongue midline. head turning and shoulder shrug were normal and symmetric.Tongue protrusion into cheek strength was normal. Motor: normal bulk and tone, full strength in the BUE, BLE, fine finger movements  normal, no pronator drift. No focal weakness Coordination: finger-nose-finger, heel-to-shin bilaterally, no dysmetria Reflexes: Brachioradialis 2/2, biceps 2/2, triceps 2/2, patellar 2/2, Achilles 2/2, plantar responses were flexor bilaterally. Gait and Station: Rising up from seated position without assistance, normal stance, moderate stride, good arm swing, smooth turning, able to perform tiptoe, and heel walking without difficulty. Tandem gait is steady  DIAGNOSTIC DATA (LABS, IMAGING, TESTING) 04/07/15 MRI of the neck with impression : Prior fusion C5-6 and C6-7 with mild reversal of the normal cervical lordosis.No significant cervical spinal stenosis or foraminal narrowing. 05/16/15 MRI of the brain ordered by Dr. Ellene Route was normal-  -ASSESSMENT AND PLAN 50 y.o. year old female has a past medical history of Anxiety and depression; Fibromyalgia; Chronic  neck pain; Narcotic dependence; and headaches for over 21 years. Essential tremor and obstructive sleep apnea here to follow-up.  Increase Topamax to 75 mg twice daily will refill Continue Maxalt at current dose refill Continue follow-up with Dr. Silvio Pate and Dr. Hardin Negus Need to  follow-up with equipment company for BiPAP mask fitting Reviewed migraine triggers Follow-up in 6 months Dennie Bible, [facility], [facility], [facility] [address] [place], [place] [address] [phone]

## 2015-12-29 NOTE — Patient Instructions (Signed)
Increase Topamax to 75 mg twice daily will refill Continue Maxalt at current dose refill Continue follow-up with Dr. Silvio Pate and Dr. Hardin Negus Follow-up in 6 months

## 2016-01-06 ENCOUNTER — Ambulatory Visit: Payer: BLUE CROSS/BLUE SHIELD | Admitting: Nurse Practitioner

## 2016-01-14 ENCOUNTER — Other Ambulatory Visit: Payer: Self-pay | Admitting: Nurse Practitioner

## 2016-01-26 ENCOUNTER — Other Ambulatory Visit: Payer: Self-pay | Admitting: Physician Assistant

## 2016-01-26 DIAGNOSIS — R1013 Epigastric pain: Secondary | ICD-10-CM

## 2016-01-26 DIAGNOSIS — R7989 Other specified abnormal findings of blood chemistry: Secondary | ICD-10-CM

## 2016-01-26 DIAGNOSIS — R1011 Right upper quadrant pain: Secondary | ICD-10-CM

## 2016-01-26 DIAGNOSIS — R945 Abnormal results of liver function studies: Secondary | ICD-10-CM

## 2016-02-04 ENCOUNTER — Ambulatory Visit
Admission: RE | Admit: 2016-02-04 | Discharge: 2016-02-04 | Disposition: A | Discharge status: Home / Self Care | Payer: BLUE CROSS/BLUE SHIELD | Source: Ambulatory Visit | Attending: Physician Assistant | Admitting: Physician Assistant

## 2016-02-04 DIAGNOSIS — R945 Abnormal results of liver function studies: Secondary | ICD-10-CM

## 2016-02-04 DIAGNOSIS — R1011 Right upper quadrant pain: Secondary | ICD-10-CM

## 2016-02-04 DIAGNOSIS — R1013 Epigastric pain: Secondary | ICD-10-CM

## 2016-02-04 DIAGNOSIS — R7989 Other specified abnormal findings of blood chemistry: Secondary | ICD-10-CM

## 2016-02-05 ENCOUNTER — Other Ambulatory Visit: Payer: Self-pay | Admitting: Internal Medicine

## 2016-02-05 DIAGNOSIS — R7989 Other specified abnormal findings of blood chemistry: Secondary | ICD-10-CM

## 2016-02-05 DIAGNOSIS — R109 Unspecified abdominal pain: Secondary | ICD-10-CM

## 2016-02-05 DIAGNOSIS — R9389 Abnormal findings on diagnostic imaging of other specified body structures: Secondary | ICD-10-CM

## 2016-02-05 DIAGNOSIS — R945 Abnormal results of liver function studies: Principal | ICD-10-CM

## 2016-02-15 ENCOUNTER — Ambulatory Visit
Admission: RE | Admit: 2016-02-15 | Discharge: 2016-02-15 | Disposition: A | Discharge status: Home / Self Care | Payer: BLUE CROSS/BLUE SHIELD | Source: Ambulatory Visit | Attending: Internal Medicine | Admitting: Internal Medicine

## 2016-02-15 DIAGNOSIS — R109 Unspecified abdominal pain: Secondary | ICD-10-CM

## 2016-02-15 DIAGNOSIS — R9389 Abnormal findings on diagnostic imaging of other specified body structures: Secondary | ICD-10-CM

## 2016-02-15 DIAGNOSIS — R7989 Other specified abnormal findings of blood chemistry: Secondary | ICD-10-CM

## 2016-02-15 DIAGNOSIS — R945 Abnormal results of liver function studies: Principal | ICD-10-CM

## 2016-04-25 ENCOUNTER — Telehealth: Payer: Self-pay | Admitting: *Deleted

## 2016-04-25 MED ORDER — TOPIRAMATE 25 MG PO TABS: ORAL_TABLET | ORAL | Status: DC

## 2016-04-25 NOTE — Telephone Encounter (Signed)
Pt called, has question about topiramate (TOPAMAX) 25 MG tablet requesting 180 pills, pickup 150 pills from drug store.

## 2016-04-25 NOTE — Telephone Encounter (Signed)
I spoke to patient and verified tha tshe should be getting 180tabs. I advised her that I will send new Rx in, confirmed pharmacy.

## 2016-05-27 ENCOUNTER — Other Ambulatory Visit: Payer: Self-pay | Admitting: Nurse Practitioner

## 2016-06-07 ENCOUNTER — Other Ambulatory Visit: Payer: Self-pay | Admitting: Nurse Practitioner

## 2016-06-21 DIAGNOSIS — E559 Vitamin D deficiency, unspecified: Secondary | ICD-10-CM | POA: Insufficient documentation

## 2016-06-21 DIAGNOSIS — E785 Hyperlipidemia, unspecified: Secondary | ICD-10-CM | POA: Insufficient documentation

## 2016-06-21 DIAGNOSIS — Z79899 Other long term (current) drug therapy: Secondary | ICD-10-CM | POA: Insufficient documentation

## 2016-06-29 ENCOUNTER — Ambulatory Visit: Payer: BLUE CROSS/BLUE SHIELD | Admitting: Nurse Practitioner

## 2016-07-14 ENCOUNTER — Encounter: Payer: Self-pay | Admitting: Nurse Practitioner

## 2016-07-14 ENCOUNTER — Ambulatory Visit (INDEPENDENT_AMBULATORY_CARE_PROVIDER_SITE_OTHER): Payer: BLUE CROSS/BLUE SHIELD | Admitting: Nurse Practitioner

## 2016-07-14 VITALS — BP 125/82 | HR 91 | Ht 63.5 in | Wt 233.0 lb

## 2016-07-14 DIAGNOSIS — R51 Headache: Secondary | ICD-10-CM

## 2016-07-14 DIAGNOSIS — R519 Headache, unspecified: Secondary | ICD-10-CM

## 2016-07-14 DIAGNOSIS — G43909 Migraine, unspecified, not intractable, without status migrainosus: Secondary | ICD-10-CM | POA: Diagnosis not present

## 2016-07-14 MED ORDER — TOPIRAMATE 25 MG PO TABS: 50.0000 mg | ORAL_TABLET | Freq: Two times a day (BID) | ORAL | 6 refills | Status: DC

## 2016-07-14 MED ORDER — RIZATRIPTAN BENZOATE 10 MG PO TABS: ORAL_TABLET | ORAL | 6 refills | Status: DC

## 2016-07-14 NOTE — Patient Instructions (Signed)
Continue  Topamax 50mg   twice daily will refill Continue Maxalt at current dose refill Continue follow-up with Dr. Silvio Pate and Dr. Hardin Negus Follow-up in 6 months

## 2016-07-14 NOTE — Progress Notes (Signed)
[facility]  PATIENT: Nicole Bartlett DOB: 12/15/1965   REASON FOR VISIT: Follow-up for chronic headache/migraine, obstructive sleep apnea HISTORY FROM: Patient    HISTORY OF PRESENT ILLNESS:Nicole Bartlett, 50 year old female returns for followup, she has a long history of headaches for more than 23 years. She was diagnosed with obstructive sleep apnea and is on BiPAP. Her sleep physician is in [place] . She has chronic neck pain and most recent MRI of the neck without significant cervical spinal stenosis or foraminal narrowing.She also had an MRI of the brain 05/16/15 ordered by Dr. Ellene Route which was normal . She is currently on Topamax 50mg  twice daily with good control of headaches. Her dose was  increased to 75mg  BID after her last visit but felt she had more difficulty focusing.  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 She has history of chronic pain and is seeing pain management Dr. Hardin Negus. She has major depression and sees Dr. Jordan Hawks. She returns for reevaluation.  HISTORY: HAs occurring daily for almost 20 yrs- usually in back or front, dull, aching, not lateralizing by Dr. Doy Mince on 03/30/10. She has taken APAP and ibuprofen for HAs in past. Now on Topamax 50mg  twice daily. Headaches less intense, throb, no nausea, light or noise sensitivity and have decreased by 50%. HAs more intense w/ menses- rarely would wake at night- no assoc visual sx, dizziness, numbness.h/o neck fusion x2 (C6-7 2002, C5-6 2010)- She goes to the pain clinic for chronic back pain. She takes Maxalt rarely but it works acutely.    REVIEW OF SYSTEMS: Full 14 system review of systems performed and notable only for those listed, all others are neg:  Constitutional:  Cardiovascular: neg Ear/Nose/Throat: neg  Skin: neg Eyes: Light sensitivity Respiratory: neg Gastroitestinal: neg  Hematology/Lymphatic: neg  Endocrine: Intolerance to heat Musculoskeletal: Joint pain neck pain and  back pain manageed of pain clinic Allergy/Immunology: neg Neurological: Headaches Psychiatric: Depression managed by psych Sleep : Obstructive sleep apnea with  BiPAP   ALLERGIES: Allergies  Allergen Reactions  . Bupropion Hives    Something in the compound drug.      HOME MEDICATIONS: Outpatient Medications Prior to Visit  Medication Sig Dispense Refill  . buPROPion (WELLBUTRIN SR) 200 MG 12 hr tablet Take 200 mg by mouth 2 (two) times daily.    . clonazePAM (KLONOPIN) 1 MG tablet Take 1 mg by mouth.    . DULoxetine (CYMBALTA) 60 MG capsule Take 60 mg by mouth.    . methocarbamol (ROBAXIN) 500 MG tablet Take 500 mg by mouth 4 (four) times daily.     . Oxycodone HCl 10 MG TABS Take 1 tablet by mouth 3 (three) times daily.     . OXYCONTIN 20 MG 12 hr tablet Take 20 mg by mouth every 12 (twelve) hours.  0  . pantoprazole (PROTONIX) 40 MG tablet Take 40 mg by mouth daily as needed.   5  . PENNSAID 2 % SOLN 2 PUMPS TWICE DAILY AS NEEDED  0  . rizatriptan (MAXALT) 10 MG tablet TAKE 1 TABLET BY MOUTH AS NEEDED FOR MIGRAINE. MAY REPEAT IN 2 HOURS 10 tablet 0  . topiramate (TOPAMAX) 25 MG tablet TAKE 3 TABLETS (75 MG TOTAL) BY MOUTH 2 (TWO) TIMES DAILY. 180 tablet 5  . VENTOLIN HFA 108 (90 Base) MCG/ACT inhaler INHALE 2 PUFF EVERY FOUR TO SIX HOURS AS NEEDED FOR WHEEZING, SHORTNESS OF BREATH AND COUGHING  0   No facility-administered medications prior to  visit.     PAST MEDICAL HISTORY: Past Medical History:  Diagnosis Date  . Anxiety and depression   . Chronic neck pain   . Fibromyalgia   . Narcotic dependence ([place])   . Tremor     PAST SURGICAL HISTORY: Past Surgical History:  Procedure Laterality Date  . C5/C6 fusion  2010  . C5/C7 fusion  2002  . CESAREAN SECTION     times 2  . GALLBLADDER SURGERY    . ROTATOR CUFF REPAIR Bilateral     FAMILY HISTORY: Family History  Problem Relation Age of Onset  . Cancer Mother   . Diabetes Father   . Migraines Sister      SOCIAL HISTORY: Social History   Social History  . Marital status: Legally Separated    Spouse name: N/A  . Number of children: 2  . Years of education: N/A   Occupational History  . [employer] History Main Topics  . Smoking status: Current Every Day Smoker    Packs/day: 1.00    Types: Cigarettes  . Smokeless tobacco: Never Used  . Alcohol use 0.0 oz/week     Comment: rare  . Drug use: No  . Sexual activity: Not on file   Other Topics Concern  . Not on file   Social History Narrative   Patient is separated.   Patient has 2 children   Patient lives at home with daughter.   Patient drinks caffeine everyday until 5pm.              PHYSICAL EXAM  Vitals:   07/14/16 0802  BP: 125/82  Pulse: 91  Weight: 233 lb (105.7 kg)  Height: 5' 3.5" (1.613 m)   Body mass index is 40.63 kg/m. Generalized: Well developed, obese female in no acute distress  Head: normocephalic and atraumatic,. Oropharynx benign  Neck: Supple, no carotid bruits , mild decreased range of motion left and right Cardiac: Regular rate rhythm, no murmur  Musculoskeletal: No deformity   Neurological examination   Mentation: Alert oriented to time, place, history taking. Follows all commands speech and language fluent  Cranial nerve II-XII: Pupils were equal round reactive to light extraocular movements were full, visual field were full on confrontational test. Facial sensation and strength were normal. hearing was intact to finger rubbing bilaterally. Uvula tongue midline. head turning and shoulder shrug were normal and symmetric.Tongue protrusion into cheek strength was normal. Motor: normal bulk and tone, full strength in the BUE, BLE, fine finger movements normal, no pronator drift. No focal weakness Coordination: finger-nose-finger, heel-to-shin bilaterally, no dysmetria Reflexes: Brachioradialis 2/2, biceps 2/2, triceps 2/2, patellar 2/2, Achilles 2/2, plantar responses  were flexor bilaterally. Gait and Station: Rising up from seated position without assistance, normal stance, moderate stride, good arm swing, smooth turning, able to perform tiptoe, and heel walking without difficulty. Tandem gait is steady  DIAGNOSTIC DATA (LABS, IMAGING, TESTING) 04/07/15 MRI of the neck with impression : Prior fusion C5-6 and C6-7 with mild reversal of the normal cervical lordosis.No significant cervical spinal stenosis or foraminal narrowing. 05/16/15 MRI of the brain ordered by Dr. Ellene Route was normal-  -ASSESSMENT AND PLAN 50 y.o. year old female has a past medical history of Anxiety and depression; Fibromyalgia; Chronic neck pain; Narcotic dependence; and headaches for over 23 years, essential tremor and obstructive sleep apnea here to follow-up.  Continue  Topamax 50mg   twice daily will refill Continue Maxalt at current dose refill Continue follow-up with Dr. Silvio Pate  and Dr. Hardin Negus Call for worsening of headaches Follow-up in 6 months Dennie Bible, [facility] [place], [facility], [facility] [address] [place], [place] [address] [phone]

## 2016-07-14 NOTE — Progress Notes (Signed)
I have read the note, and I agree with the clinical assessment and plan.  Nicole Bartlett   

## 2016-07-15 DIAGNOSIS — R7303 Prediabetes: Secondary | ICD-10-CM | POA: Insufficient documentation

## 2016-08-04 ENCOUNTER — Other Ambulatory Visit: Payer: Self-pay | Admitting: Nurse Practitioner

## 2017-01-16 ENCOUNTER — Encounter: Payer: Self-pay | Admitting: Nurse Practitioner

## 2017-01-16 ENCOUNTER — Ambulatory Visit (INDEPENDENT_AMBULATORY_CARE_PROVIDER_SITE_OTHER): Payer: BLUE CROSS/BLUE SHIELD | Admitting: Nurse Practitioner

## 2017-01-16 VITALS — BP 121/80 | HR 91 | Ht 63.5 in | Wt 213.8 lb

## 2017-01-16 DIAGNOSIS — G4733 Obstructive sleep apnea (adult) (pediatric): Secondary | ICD-10-CM

## 2017-01-16 DIAGNOSIS — R51 Headache: Secondary | ICD-10-CM

## 2017-01-16 DIAGNOSIS — G43909 Migraine, unspecified, not intractable, without status migrainosus: Secondary | ICD-10-CM

## 2017-01-16 DIAGNOSIS — R519 Headache, unspecified: Secondary | ICD-10-CM

## 2017-01-16 MED ORDER — RIZATRIPTAN BENZOATE 10 MG PO TABS: ORAL_TABLET | ORAL | 1 refills | Status: DC

## 2017-01-16 MED ORDER — TOPIRAMATE 25 MG PO TABS: ORAL_TABLET | ORAL | 1 refills | Status: DC

## 2017-01-16 NOTE — Progress Notes (Signed)
[facility]  PATIENT: Nicole Bartlett DOB: 1966-03-15   REASON FOR VISIT: Follow-up for chronic headache/migraine, obstructive sleep apnea HISTORY FROM: Patient    HISTORY OF PRESENT ILLNESS:Nicole Bartlett, 51 year old female returns for followup, she has a long history of headaches for more than 24 years. She was diagnosed with obstructive sleep apnea and is suppose to be using  BiPAP. She has a problem with mask and is currently not using at night. Her sleep physician is in [place] . She has chronic neck pain and most recent MRI of the neck without significant cervical spinal stenosis or foraminal narrowing.She also had an MRI of the brain 05/16/15 which was normal . Since last seen she was having difficulty focusing so she reduced her  Topamax to 25mg  in the am and 50mg  at night. She continues to have migraines however the decreased dose did not affect the number. She has 1-2 headaches per week, most last no more than 30 minutes. Maxalt continues to work acutely. She also stopped her clonazepam due to difficulty focusing and slurring of speech.  She continues to work as  a Engineer, maintenance ([employer]).  She has history of chronic pain and is seeing pain management Dr. Hardin Negus. She has major depression and sees Dr. Jordan Hawks. She returns for reevaluation.  HISTORY: HAs occurring daily for almost 20 yrs- usually in back or front, dull, aching, not lateralizing by Dr. Doy Mince on 03/30/10. She has taken APAP and ibuprofen for HAs in past. Now on Topamax 50mg  twice daily. Headaches less intense, throb, no nausea, light or noise sensitivity and have decreased by 50%. HAs more intense w/ menses- rarely would wake at night- no assoc visual sx, dizziness, numbness.h/o neck fusion x2 (C6-7 2002, C5-6 2010)- She goes to the pain clinic for chronic back pain. She takes Maxalt rarely but it works acutely.    REVIEW OF SYSTEMS: Full 14 system review of systems performed and notable only for those listed, all  others are neg:  Constitutional:  Cardiovascular: neg Ear/Nose/Throat: neg  Skin: neg Eyes: Light sensitivity Respiratory: neg Gastroitestinal: neg  Hematology/Lymphatic: Easy bruising Endocrine: Intolerance to heat Musculoskeletal: Joint pain neck pain and back pain manageed of pain clinic Allergy/Immunology: Environmental Neurological: Headaches Psychiatric: Depression managed by psych Sleep : Obstructive sleep apnea with  BiPAP, currently not using   ALLERGIES: Allergies  Allergen Reactions  . Bupropion Hives    Something in the compound drug.      HOME MEDICATIONS: Outpatient Medications Prior to Visit  Medication Sig Dispense Refill  . buPROPion (WELLBUTRIN SR) 200 MG 12 hr tablet Take 200 mg by mouth 2 (two) times daily.    . cholecalciferol (VITAMIN D) 1000 units tablet Take 1,000 Units by mouth daily.    . DULoxetine (CYMBALTA) 60 MG capsule Take 60 mg by mouth.    . methocarbamol (ROBAXIN) 500 MG tablet Take 500 mg by mouth 4 (four) times daily.     . Oxycodone HCl 10 MG TABS Take 1 tablet by mouth 3 (three) times daily.     . OXYCONTIN 20 MG 12 hr tablet Take 20 mg by mouth every 12 (twelve) hours.  0  . pantoprazole (PROTONIX) 40 MG tablet Take 40 mg by mouth daily as needed.   5  . PENNSAID 2 % SOLN 2 PUMPS TWICE DAILY AS NEEDED  0  . rizatriptan (MAXALT) 10 MG tablet TAKE 1 TABLET BY MOUTH AS NEEDED FOR MIGRAINE. MAY REPEAT IN 2 HOURS 10 tablet 6  . topiramate (  TOPAMAX) 25 MG tablet Take 2 tablets (50 mg total) by mouth 2 (two) times daily. TAKE 2 TABLETS (50 MG TOTAL) BY MOUTH 2 (TWO) TIMES DAILY. (Patient taking differently: Take 50 mg by mouth 2 (two) times daily. Take 1 tab po am, 2 tabs po pm.) 120 tablet 6  . VENTOLIN HFA 108 (90 Base) MCG/ACT inhaler INHALE 2 PUFF EVERY FOUR TO SIX HOURS AS NEEDED FOR WHEEZING, SHORTNESS OF BREATH AND COUGHING  0  . clonazePAM (KLONOPIN) 1 MG tablet Take 1 mg by mouth.     No facility-administered medications prior to  visit.     PAST MEDICAL HISTORY: Past Medical History:  Diagnosis Date  . Anxiety and depression   . Chronic neck pain   . Fibromyalgia   . Narcotic dependence ([place])   . Tremor     PAST SURGICAL HISTORY: Past Surgical History:  Procedure Laterality Date  . C5/C6 fusion  2010  . C5/C7 fusion  2002  . CESAREAN SECTION     times 2  . GALLBLADDER SURGERY    . ROTATOR CUFF REPAIR Bilateral     FAMILY HISTORY: Family History  Problem Relation Age of Onset  . Cancer Mother   . Diabetes Father   . Migraines Sister     SOCIAL HISTORY: Social History   Social History  . Marital status: Legally Separated    Spouse name: N/A  . Number of children: 2  . Years of education: N/A   Occupational History  . [employer] Main Topics  . Smoking status: Current Every Day Smoker    Packs/day: 1.00    Types: Cigarettes  . Smokeless tobacco: Never Used  . Alcohol use 0.0 oz/week     Comment: rare  . Drug use: No  . Sexual activity: Not on file   Other Topics Concern  . Not on file   Social History Narrative   Patient is separated.   Patient has 2 children   Patient lives at home with daughter.   Patient drinks caffeine everyday until 5pm.              PHYSICAL EXAM  Vitals:   01/16/17 0801  BP: 121/80  Pulse: 91  Weight: 213 lb 12.8 oz (97 kg)  Height: 5' 3.5" (1.613 m)   Body mass index is 37.28 kg/m. Generalized: Well developed, obese female in no acute distress  Head: normocephalic and atraumatic,. Oropharynx benign  Neck: Supple, no carotid bruits  Cardiac: Regular rate rhythm, no murmur  Musculoskeletal: No deformity   Neurological examination   Mentation: Alert oriented to time, place, history taking. Follows all commands speech and language fluent  Cranial nerve II-XII: Pupils were equal round reactive to light extraocular movements were full, visual field were full on confrontational test. Facial sensation and  strength were normal. hearing was intact to finger rubbing bilaterally. Uvula tongue midline. head turning and shoulder shrug were normal and symmetric.Tongue protrusion into cheek strength was normal. Motor: normal bulk and tone, full strength in the BUE, BLE, fine finger movements normal, no pronator drift. No focal weakness Coordination: finger-nose-finger, heel-to-shin bilaterally, no dysmetria Reflexes: Brachioradialis 2/2, biceps 2/2, triceps 2/2, patellar 2/2, Achilles 2/2, plantar responses were flexor bilaterally. Gait and Station: Rising up from seated position without assistance, normal stance, moderate stride, good arm swing, smooth turning, able to perform tiptoe, and heel walking without difficulty. Tandem gait is steady  DIAGNOSTIC DATA (LABS, IMAGING, TESTING) -ASSESSMENT AND PLAN 51  y.o. year old female has a past medical history of Anxiety and depression; Fibromyalgia; Chronic neck pain; Narcotic dependence; and headaches for over 24 years, essential tremor and obstructive sleep apnea here to follow-up. Due to inability to focus she reduced her Topamax since last seen and this has improved. She also discontinued clonazepam due to some slurring of speech.  PLAN: Continue  Topamax 25 mg am and 50mg   daily will refill Continue Maxalt at current dose  Will refill Continue follow-up with Dr. Silvio Pate and Dr. Hardin Negus Call for worsening of headaches Follow-up with sleep physician for proper mask fit, having obstructive sleep apnea and not using the BiPAP can affect the frequency of your headaches  Follow-up in 6 months I spent 49min in total face to face time with the patient more than 50% of which was spent counseling and coordination of care, reviewing test results reviewing medications and discussing and reviewing the diagnosis of chronic migraine and further treatment options. We may continue to titrate off of the Topamax in the future. Discuss at the next visit Dennie Bible,  [facility], [facility], [facility] [address], [place], [place] [address] [phone]

## 2017-01-16 NOTE — Patient Instructions (Signed)
Continue  Topamax 25 mg am and 50mg   daily will refill Continue Maxalt at current dose refill Continue follow-up with Dr. Silvio Pate and Dr. Hardin Negus Call for worsening of headaches Follow-up in 6 months

## 2017-01-27 ENCOUNTER — Ambulatory Visit: Payer: BLUE CROSS/BLUE SHIELD | Admitting: Medical

## 2017-01-27 ENCOUNTER — Encounter: Payer: Self-pay | Admitting: Medical

## 2017-01-27 VITALS — BP 108/80 | HR 92 | Temp 98.0°F | Resp 16 | Ht 64.0 in | Wt 216.0 lb

## 2017-01-27 DIAGNOSIS — H6993 Unspecified Eustachian tube disorder, bilateral: Secondary | ICD-10-CM

## 2017-01-27 DIAGNOSIS — R220 Localized swelling, mass and lump, head: Secondary | ICD-10-CM

## 2017-01-27 DIAGNOSIS — H9313 Tinnitus, bilateral: Secondary | ICD-10-CM

## 2017-01-27 DIAGNOSIS — J0101 Acute recurrent maxillary sinusitis: Secondary | ICD-10-CM

## 2017-01-27 DIAGNOSIS — H60391 Other infective otitis externa, right ear: Secondary | ICD-10-CM

## 2017-01-27 DIAGNOSIS — R590 Localized enlarged lymph nodes: Secondary | ICD-10-CM

## 2017-01-27 DIAGNOSIS — H6983 Other specified disorders of Eustachian tube, bilateral: Secondary | ICD-10-CM

## 2017-01-27 DIAGNOSIS — Z720 Tobacco use: Secondary | ICD-10-CM

## 2017-01-27 MED ORDER — AMOXICILLIN 875 MG PO TABS: 875.0000 mg | ORAL_TABLET | Freq: Two times a day (BID) | ORAL | 0 refills | Status: AC

## 2017-01-27 NOTE — Progress Notes (Signed)
   Subjective:    Patient ID: Nicole Bartlett, female    DOB: Nov 12, 1966, 51 y.o.   MRN: [id number]  HPI  51 yo female with right ear pain and  muffled hearing, and swelling in lymph nodes X 3 days. Seen 09/21/16 for sinusitis and  was already on ampillcin for boils located in her groin. Started on prednisone dose pak 11/30/2017estchian tube dysfuction with  tinnitus, right jaw pain . Patient was suppose to return to clinic if not improving. She states it never got better.On no antibiotics now. Continued to have ringing in both ears  L.>R. Right sided lymph node and neck swelling and lip swelling on right side since last fall per patient. Patient brings a picture of her face from yesterday, showing right sided facial swelling including cheek, lip and upper eyelid area ( all located only on the right side). Denies any shortness of breath or chest pain.    Review of Systems  Constitutional: Positive for fever.  HENT: Positive for congestion, hearing loss, nosebleeds, postnasal drip, sinus pain, sinus pressure, sneezing, sore throat, tinnitus, trouble swallowing and voice change. Negative for ear discharge.   Eyes: Positive for itching and visual disturbance. Negative for photophobia.  Respiratory: Positive for cough. Negative for shortness of breath.   Cardiovascular: Negative for chest pain.  Neurological: Positive for facial asymmetry and headaches. Negative for dizziness and light-headedness.  Hematological: Positive for adenopathy.   She says low grade fever yesterday. Nosebleeds since sore in right nostril.       Objective:   Physical Exam  NAD, atraumatic Face does appear with mild swelling around right side upper eye lid, and cheek area. No erythema noted on skin. Tender to palpation preauricular and cheek area. PERRLA, EOMI Left external ear wnl. Left ear with dark fluid behind TM.  Right external ear wnl. Right canal is abraided on the base and with mild erythema. Right TM also with  dark fluid. Positive cervical chain adenopathy on the right side with tenderness on palpation. Right nostril erythema and blood noted on the medial side of nose. Left nostril with erythema and turbinate edema. Pharynx with cobblestoning noted posterior oropharynx. Teeth on right side wnl and no abscess is present along the gum line.under tongue appears to be wnl. Clear to auscultation bilaterally  Regular rate and rhythm , normal heart sounds , no murmurs, rubs or gallops.         Assessment & Plan:  Otitis externa right ear placed on Amoxil, most likely from patient itching ear with finger. Sinusitis (maybe reoccurring patient last seen in the fall), sore on right medial side of nostril, blood present. Swelling of right side of face including upper eye lid. Enlarged lymph nodes right side of neck Tinnitus bilateral will refer to ENT for further evaluation.

## 2017-01-27 NOTE — Patient Instructions (Signed)
Will set up referral with ENT for patient.

## 2017-02-26 DIAGNOSIS — J3489 Other specified disorders of nose and nasal sinuses: Secondary | ICD-10-CM | POA: Insufficient documentation

## 2017-05-11 ENCOUNTER — Other Ambulatory Visit: Payer: Self-pay | Admitting: Nurse Practitioner

## 2017-07-05 DIAGNOSIS — K5903 Drug induced constipation: Secondary | ICD-10-CM | POA: Insufficient documentation

## 2017-07-15 ENCOUNTER — Other Ambulatory Visit: Payer: Self-pay | Admitting: Nurse Practitioner

## 2017-07-19 NOTE — Progress Notes (Signed)
[facility]  PATIENT: Nicole Bartlett DOB: 17-Nov-1966   REASON FOR VISIT: Follow-up for chronic headache/migraine, obstructive sleep apnea HISTORY FROM: Patient    HISTORY OF PRESENT ILLNESS:Nicole Bartlett, 51 year old female returns for followup, she has a long history of headaches for more than 24 years. She was diagnosed with obstructive sleep apnea and is suppose to be using  BiPAP. She has a problem with mask and is currently not using at night. Her sleep physician is in [place] . She was made aware that she needs to follow-up with her sleep physician or DME for proper mask fitting as this can also affect her headaches. She has chronic neck pain and most recent MRI of the neck without significant cervical spinal stenosis or foraminal narrowing.She also had an MRI of the brain 05/16/15 which was normal . She remains on   Topamax to 25mg  in the am and 50mg  at night. She continues to have migraines at most 4 per month. Her headaches last 30 minutes. Higher dose of Topamax caused her to have difficulty focusing.  Maxalt continues to work acutely. She also stopped her clonazepam due to difficulty focusing and slurring of speech.  She continues to work as  a Engineer, maintenance ([employer]).  She has history of chronic pain and is seeing pain management Dr. Hardin Negus. She has major depression and sees Dr. Jordan Hawks. She returns for reevaluation.  HISTORY: HAs occurring daily for almost 20 yrs- usually in back or front, dull, aching, not lateralizing by Dr. Doy Mince on 03/30/10. She has taken APAP and ibuprofen for HAs in past. Now on Topamax 50mg  twice daily. Headaches less intense, throb, no nausea, light or noise sensitivity and have decreased by 50%. HAs more intense w/ menses- rarely would wake at night- no assoc visual sx, dizziness, numbness.h/o neck fusion x2 (C6-7 2002, C5-6 2010)- She goes to the pain clinic for chronic back pain. She takes Maxalt rarely but it works acutely.    REVIEW OF  SYSTEMS: Full 14 system review of systems performed and notable only for those listed, all others are neg:  Constitutional:  Cardiovascular: neg Ear/Nose/Throat: neg  Skin: neg Eyes: Light sensitivity Respiratory: neg Gastroitestinal: neg  Hematology/Lymphatic: Easy bruising Endocrine: Intolerance to heat Musculoskeletal: Joint pain neck pain and back pain manageed of pain clinic Allergy/Immunology: Environmental Neurological: Headaches Psychiatric: Depression managed by psych Sleep : Obstructive sleep apnea with  BiPAP, currently not using   ALLERGIES: Allergies  Allergen Reactions  . Bupropion Hives    Something in the compound drug.    . Sulfa Antibiotics Itching    and angioedema    HOME MEDICATIONS: Outpatient Medications Prior to Visit  Medication Sig Dispense Refill  . buPROPion (WELLBUTRIN SR) 200 MG 12 hr tablet Take 200 mg by mouth 2 (two) times daily.    . DULoxetine (CYMBALTA) 60 MG capsule Take 60 mg by mouth.    . methocarbamol (ROBAXIN) 500 MG tablet Take 500 mg by mouth 4 (four) times daily.     . Oxycodone HCl 10 MG TABS Take 1 tablet by mouth 3 (three) times daily.     . OXYCONTIN 20 MG 12 hr tablet Take 20 mg by mouth every 12 (twelve) hours.  0  . pantoprazole (PROTONIX) 40 MG tablet Take 40 mg by mouth daily as needed.   5  . PENNSAID 2 % SOLN 2 PUMPS TWICE DAILY AS NEEDED  0  . rizatriptan (MAXALT) 10 MG tablet TAKE 1 TABLET BY MOUTH AS NEEDED FOR MIGRAINE.  MAY REPEAT IN 2 HOURS 10 tablet 2  . topiramate (TOPAMAX) 25 MG tablet Take 1 tab po am, 2 tabs po pm. 270 tablet 1  . cholecalciferol (VITAMIN D) 1000 units tablet Take 1,000 Units by mouth daily.    . hydrocortisone 2.5 % cream APPLY TWO TIMES DAILY TOPICALLY 14 DAYS  0  . VENTOLIN HFA 108 (90 Base) MCG/ACT inhaler INHALE 2 PUFF EVERY FOUR TO SIX HOURS AS NEEDED FOR WHEEZING, SHORTNESS OF BREATH AND COUGHING  0   No facility-administered medications prior to visit.     PAST MEDICAL  HISTORY: Past Medical History:  Diagnosis Date  . Anxiety and depression   . Chronic neck pain   . Fibromyalgia   . Narcotic dependence ([place])   . Tremor     PAST SURGICAL HISTORY: Past Surgical History:  Procedure Laterality Date  . C5/C6 fusion  2010  . C5/C7 fusion  2002  . CESAREAN SECTION     times 2  . GALLBLADDER SURGERY    . ROTATOR CUFF REPAIR Bilateral     FAMILY HISTORY: Family History  Problem Relation Age of Onset  . Cancer Mother   . Diabetes Father   . Migraines Sister     SOCIAL HISTORY: Social History   Social History  . Marital status: Legally Separated    Spouse name: N/A  . Number of children: 2  . Years of education: N/A   Occupational History  . [employer] History Main Topics  . Smoking status: Current Every Day Smoker    Packs/day: 1.00    Types: Cigarettes  . Smokeless tobacco: Never Used  . Alcohol use 0.0 oz/week     Comment: rare  . Drug use: No  . Sexual activity: Not on file   Other Topics Concern  . Not on file   Social History Narrative   Patient is separated.   Patient has 2 children   Patient lives at home with daughter.   Patient drinks caffeine everyday until 5pm.              PHYSICAL EXAM  Vitals:   07/20/17 0747  BP: 103/72  Pulse: 80  Weight: 186 lb 3.2 oz (84.5 kg)  Height: 5\' 4"  (1.626 m)   Body mass index is 31.96 kg/m. Generalized: Well developed, obese female in no acute distress  Head: normocephalic and atraumatic,. Oropharynx benign  Neck: Supple, no carotid bruits  Cardiac: Regular rate rhythm, no murmur  Musculoskeletal: No deformity   Neurological examination   Mentation: Alert oriented to time, place, history taking. Follows all commands speech and language fluent  Cranial nerve II-XII: Pupils were equal round reactive to light extraocular movements were full, visual field were full on confrontational test. Facial sensation and strength were normal. hearing was  intact to finger rubbing bilaterally. Uvula tongue midline. head turning and shoulder shrug were normal and symmetric.Tongue protrusion into cheek strength was normal. Motor: normal bulk and tone, full strength in the BUE, BLE, fine finger movements normal, no pronator drift. No focal weakness Coordination: finger-nose-finger, heel-to-shin bilaterally, no dysmetria Reflexes: Brachioradialis 2/2, biceps 2/2, triceps 2/2, patellar 2/2, Achilles 2/2, plantar responses were flexor bilaterally. Gait and Station: Rising up from seated position without assistance, normal stance, moderate stride, good arm swing, smooth turning, able to perform tiptoe, and heel walking without difficulty. Tandem gait is steady  DIAGNOSTIC DATA (LABS, IMAGING, TESTING) -ASSESSMENT AND PLAN 51 y.o. year old female has a past medical  history of Anxiety and depression; Fibromyalgia; Chronic neck pain; Narcotic dependence; and headaches for over 24 years, essential tremor and obstructive sleep apnea here to follow-up. Due to inability to focus she reduced her Topamax since last seen and this has improved. She also discontinued clonazepam due to some slurring of speech.The patient is a current patient of Dr. Jannifer Franklin  who is out of the office today . This note is sent to the work in doctor.     PLAN: Continue  Topamax 25 mg am and 50mg   daily will refill Continue Maxalt at current dose  Will refill Continue follow-up with Dr. Silvio Pate and Dr. Hardin Negus Call for worsening of headaches She has  obstructive sleep apnea and is suppose to be using  BiPAP. She has a problem with mask and is currently not using at night. Her sleep physician is in [place] . She was made aware that she needs to follow-up with her sleep physician or DME for proper mask fitting as this can also affect her headaches. Follow-up in 8 months I spent 76min in total face to face time with the patient more than 50% of which was spent counseling and coordination of  care, reviewing test results reviewing medications and discussing and reviewing the diagnosis of chronic migraine and further treatment options. We may continue to titrate off of the Topamax in the future. Discuss at the next visit Dennie Bible, [facility], [facility], [facility] [address] [place], [place] [address] [phone]

## 2017-07-20 ENCOUNTER — Encounter: Payer: Self-pay | Admitting: Nurse Practitioner

## 2017-07-20 ENCOUNTER — Ambulatory Visit (INDEPENDENT_AMBULATORY_CARE_PROVIDER_SITE_OTHER): Payer: BLUE CROSS/BLUE SHIELD | Admitting: Nurse Practitioner

## 2017-07-20 VITALS — BP 103/72 | HR 80 | Ht 64.0 in | Wt 186.2 lb

## 2017-07-20 DIAGNOSIS — R519 Headache, unspecified: Secondary | ICD-10-CM

## 2017-07-20 DIAGNOSIS — G43909 Migraine, unspecified, not intractable, without status migrainosus: Secondary | ICD-10-CM

## 2017-07-20 DIAGNOSIS — R51 Headache: Secondary | ICD-10-CM

## 2017-07-20 MED ORDER — RIZATRIPTAN BENZOATE 10 MG PO TABS: ORAL_TABLET | ORAL | 2 refills | Status: DC

## 2017-07-20 MED ORDER — TOPIRAMATE 25 MG PO TABS: ORAL_TABLET | ORAL | 2 refills | Status: DC

## 2017-07-20 NOTE — Patient Instructions (Signed)
Continue  Topamax 25 mg am and 50mg   daily will refill Continue Maxalt at current dose  Will refill Continue follow-up with Dr. Silvio Pate and Dr. Hardin Negus Call for worsening of headaches Follow-up in 8 months

## 2017-07-27 NOTE — Progress Notes (Signed)
I have reviewed and agreed above plan. 

## 2017-08-23 ENCOUNTER — Ambulatory Visit: Payer: BLUE CROSS/BLUE SHIELD | Admitting: Medical

## 2017-08-23 VITALS — HR 108 | Temp 97.9°F | Resp 18 | Ht 64.0 in | Wt 183.0 lb

## 2017-08-23 DIAGNOSIS — L2489 Irritant contact dermatitis due to other agents: Secondary | ICD-10-CM

## 2017-08-23 DIAGNOSIS — B354 Tinea corporis: Secondary | ICD-10-CM

## 2017-08-23 MED ORDER — CLOTRIMAZOLE-BETAMETHASONE 1-0.05 % EX CREA: 1.0000 "application " | TOPICAL_CREAM | Freq: Two times a day (BID) | CUTANEOUS | 0 refills | Status: DC

## 2017-08-23 NOTE — Progress Notes (Signed)
Subjective:    Patient ID: Nicole Bartlett, female    DOB: 23-Dec-1965, 51 y.o.   MRN: [id number]  HPI  51 yo female in non acute distress comes in today for 2 rashes today. Left base of thumb on the volar surface with itching area  painful . Neck itching some and burning and hurting on Monday. Started on Saturday.  Patient has not put any medications or lotion on rashes. Wearing a new glasses  From sunglasses rope.  Review of Systems  Constitutional: Negative for chills and fever.  HENT: Negative for congestion, ear pain and sore throat.   Eyes: Negative for discharge and itching.  Respiratory: Negative for cough and shortness of breath.   Cardiovascular: Negative for chest pain.  Gastrointestinal: Negative for abdominal pain.  Genitourinary: Negative for dysuria.  Musculoskeletal: Positive for back pain and neck pain.       Chronic back pain history. Neck pain due to rash.  Skin: Positive for rash.  Allergic/Immunologic: Positive for environmental allergies. Negative for food allergies.  Neurological: Negative for dizziness and syncope.  Hematological: Negative for adenopathy.   August had Pneumobilia, treated by [facility] , appointment seen by GI doctor Dr.Jue, ended up having a endoscopy this week , they thought inflammation of the stomach, placed on DoxepinTakes at bed time.    Objective:   Physical Exam  Constitutional: She is oriented to person, place, and time. She appears well-developed and well-nourished.  HENT:  Head: Normocephalic and atraumatic.  Eyes: Conjunctivae and EOM are normal.  Cardiovascular: Normal rate, regular rhythm and normal heart sounds.   Pulmonary/Chest: Effort normal and breath sounds normal.  Lymphadenopathy:    She has no cervical adenopathy.  Neurological: She is alert and oriented to person, place, and time.  Skin: Skin is warm and dry. Rash noted. There is erythema.  Psychiatric: She has a normal mood and affect. Her behavior is  normal. Judgment and thought content normal.  Nursing note and vitals reviewed.  Circular looking rash with abraided edges on base of left thumb volar surface.    Scaly red rash around  Base of neck excoriated. Assessment & Plan:  Tinea corporis left thumb Dermatitis base of neck.  Patient prescribed Lotrisone apply sparingly to affected areas twice daily. To the thumb for 14 days. Usually uses Zyrtec , recommend daily use for up to a  Week to help with itching.  To cover thumb with a dressing to prevent spreading of fungal infection.  .meds.   below medications were updated this medical record. Current Meds  Medication Sig  . DOXEPIN HCL PO Take 10 mg by mouth.  . DULoxetine (CYMBALTA) 30 MG capsule Take 30 mg by mouth every morning.  . DULoxetine (CYMBALTA) 60 MG capsule duloxetine 60 mg capsule,delayed release  . linaclotide (LINZESS) 290 MCG CAPS capsule Take by mouth.  . methocarbamol (ROBAXIN) 500 MG tablet Take 500 mg by mouth 4 (four) times daily.   Marland Kitchen oxyCODONE (OXYCONTIN) 20 mg 12 hr tablet OxyContin 20 mg tablet,crush resistant,extended release  . Oxycodone HCl 10 MG TABS Take 10 mg by mouth 3 (three) times daily.  . pantoprazole (PROTONIX) 40 MG tablet Take 40 mg by mouth daily as needed.   Marland Kitchen PENNSAID 2 % SOLN 2 PUMPS TWICE DAILY AS NEEDED  . rizatriptan (MAXALT) 10 MG tablet TAKE 1 TABLET BY MOUTH AS NEEDED FOR MIGRAINE. MAY REPEAT IN 2 HOURS  . topiramate (TOPAMAX) 25 MG tablet Take 1 tab po am, 2  tabs po pm.  . [DISCONTINUED] Diclofenac Sodium (PENNSAID) 2 % SOLN USE 2 PUMPS TWICE DAILY AS NEEDED   Going out of town. So Patient will call  And return  By next Tuesday if skin, is red, or she has fever , reviewed signs and symptoms of infection,or any other concerns. Given Loratadine 10 mg lot  5678 exp 9/19 in clinic to help with itching today. She verbalizes understanding and has no questions at discharge.

## 2017-08-23 NOTE — Patient Instructions (Signed)
Return to the clinic as needed.   Body Ringworm Body ringworm is an infection of the skin that often causes a ring-shaped rash. Body ringworm can affect any part of your skin. It can spread easily to others. Body ringworm is also called tinea corporis. What are the causes? This condition is caused by funguses called dermatophytes. The condition develops when these funguses grow out of control on the skin. You can get this condition if you touch a person or animal that has it. You can also get it if you share clothing, bedding, towels, or any other object with an infected person or pet. What increases the risk? This condition is more likely to develop in:  Athletes who often make skin-to-skin contact with other athletes, such as wrestlers.  People who share equipment and mats.  People with a weakened immune system.  What are the signs or symptoms? Symptoms of this condition include:  Itchy, raised red spots and bumps.  Red scaly patches.  A ring-shaped rash. The rash may have: ? A clear center. ? Scales or red bumps at its center. ? Redness near its borders. ? Dry and scaly skin on or around it.  How is this diagnosed? This condition can usually be diagnosed with a skin exam. A skin scraping may be taken from the affected area and examined under a microscope to see if the fungus is present. How is this treated? This condition may be treated with:  An antifungal cream or ointment.  An antifungal shampoo.  Antifungal medicines. These may be prescribed if your ringworm is severe, keeps coming back, or lasts a long time.  Follow these instructions at home:  Take over-the-counter and prescription medicines only as told by your health care provider.  If you were given an antifungal cream or ointment: ? Use it as told by your health care provider. ? Wash the infected area and dry it completely before applying the cream or ointment.  If you were given an antifungal  shampoo: ? Use it as told by your health care provider. ? Leave the shampoo on your body for 3-5 minutes before rinsing.  While you have a rash: ? Wear loose clothing to stop clothes from rubbing and irritating it. ? Wash or change your bed sheets every night.  If your pet has the same infection, take your pet to see a Animal nutritionist. How is this prevented?  Practice good hygiene.  Wear sandals or shoes in public places and showers.  Do not share personal items with others.  Avoid touching red patches of skin on other people.  Avoid touching pets that have bald spots.  If you touch an animal that has a bald spot, wash your hands. Contact a health care provider if:  Your rash continues to spread after 7 days of treatment.  Your rash is not gone in 4 weeks.  The area around your rash gets red, warm, tender, and swollen. This information is not intended to replace advice given to you by your health care provider. Make sure you discuss any questions you have with your health care provider. Document Released: 11/04/2000 Document Revised: 04/14/2016 Document Reviewed: 09/03/2015 Elsevier Interactive Patient Education  [employer].

## 2017-10-06 ENCOUNTER — Ambulatory Visit: Payer: BLUE CROSS/BLUE SHIELD | Admitting: Adult Health

## 2017-10-06 ENCOUNTER — Encounter: Payer: Self-pay | Admitting: Adult Health

## 2017-10-06 VITALS — BP 110/76 | HR 92 | Temp 98.3°F | Resp 16 | Wt 180.0 lb

## 2017-10-06 DIAGNOSIS — H6693 Otitis media, unspecified, bilateral: Secondary | ICD-10-CM

## 2017-10-06 MED ORDER — AMOXICILLIN-POT CLAVULANATE 875-125 MG PO TABS: 1.0000 | ORAL_TABLET | Freq: Two times a day (BID) | ORAL | 0 refills | Status: DC

## 2017-10-06 NOTE — Patient Instructions (Signed)
Otitis Media, Adult Otitis media is redness, soreness, and puffiness (swelling) in the space just behind your eardrum (middle ear). It may be caused by allergies or infection. It often happens along with a cold. Follow these instructions at home:  Take your medicine as told. Finish it even if you start to feel better.  Only take over-the-counter or prescription medicines for pain, discomfort, or fever as told by your doctor.  Follow up with your doctor as told. Contact a doctor if:  You have otitis media only in one ear, or bleeding from your nose, or both.  You notice a lump on your neck.  You are not getting better in 3-5 days.  You feel worse instead of better. Get help right away if:  You have pain that is not helped with medicine.  You have puffiness, redness, or pain around your ear.  You get a stiff neck.  You cannot move part of your face (paralysis).  You notice that the bone behind your ear hurts when you touch it. This information is not intended to replace advice given to you by your health care provider. Make sure you discuss any questions you have with your health care provider. Document Released: 04/25/2008 Document Revised: 04/14/2016 Document Reviewed: 06/04/2013 Elsevier Interactive Patient Education  2017 Elsevier Inc.  

## 2017-10-06 NOTE — Progress Notes (Signed)
Subjective:     Patient ID: Nicole Bartlett, female   DOB: March 07, 1966, 52 y.o.   MRN: [id number]  HPI   Patient is a 51 year old female in no acute distress who comes to the clinic with complaints of pain around both ears.  She has a mild cough. Post nasal drip. Hoarse voice at times. Symptoms present x 4 days.   Denies any recent infections or hospitalizations  She has chronic pain and is on chronic pain medication.   She reports she does smoke.    Patient  denies any fever, chills, rash, chest pain, shortness of breath, nausea, vomiting.   Radiontchenko, Arion Sink, MD  Allergies  Allergen Reactions  . Sulfa Antibiotics Itching    and angioedema and angioedema  . Bupropion Hives    Something in the compound drug.       Current Outpatient Medications:  .  DOXEPIN HCL PO, Take 10 mg by mouth., Disp: , Rfl:  .  DULoxetine (CYMBALTA) 30 MG capsule, Take 30 mg by mouth every morning., Disp: , Rfl: 3 .  DULoxetine (CYMBALTA) 60 MG capsule, duloxetine 60 mg capsule,delayed release, Disp: , Rfl:  .  linaclotide (LINZESS) 290 MCG CAPS capsule, Take by mouth., Disp: , Rfl:  .  methocarbamol (ROBAXIN) 500 MG tablet, Take 500 mg by mouth 4 (four) times daily. , Disp: , Rfl:  .  oxyCODONE (OXYCONTIN) 20 mg 12 hr tablet, OxyContin 20 mg tablet,crush resistant,extended release, Disp: , Rfl:  .  Oxycodone HCl 10 MG TABS, Take 10 mg by mouth 3 (three) times daily., Disp: , Rfl:  .  pantoprazole (PROTONIX) 40 MG tablet, Take 40 mg by mouth daily as needed. , Disp: , Rfl: 5 .  PENNSAID 2 % SOLN, 2 PUMPS TWICE DAILY AS NEEDED, Disp: , Rfl: 0 .  rizatriptan (MAXALT) 10 MG tablet, TAKE 1 TABLET BY MOUTH AS NEEDED FOR MIGRAINE. MAY REPEAT IN 2 HOURS, Disp: , Rfl:  .  topiramate (TOPAMAX) 25 MG tablet, Take 1 tab po am, 2 tabs po pm., Disp: 270 tablet, Rfl: 2  Patient Active Problem List   Diagnosis Date Noted  . OSA (obstructive sleep apnea) 01/16/2017  . Migraine 12/29/2015  . Fatigue 02/06/2015   . Difficulty staying awake 02/06/2015  . Adiposity 02/06/2015  . Snores 02/06/2015  . Current tobacco use 02/06/2015  . Chronic pain associated with significant psychosocial dysfunction 09/22/2014  . Depression, major, recurrent ([place]) 09/22/2014  . Intractable migraine without aura 03/06/2014  . Headache 03/06/2014  . Absence of menstruation 08/15/2012     Review of Systems  Constitutional: Negative.   HENT: Positive for congestion, ear pain and sinus pressure. Negative for ear discharge, sneezing, sore throat, tinnitus (chronic sees PCP ), trouble swallowing and voice change.   Eyes: Negative for discharge (watery at times. ).  Respiratory: Negative.   Cardiovascular: Negative.   Gastrointestinal: Negative.        Irritable bowel has this followed per her report.   Genitourinary: Negative.   Musculoskeletal:       Chronic pain managed per patient   Skin: Negative.   Neurological: Negative.   Hematological: Negative.   Psychiatric/Behavioral: Negative.        Objective:   Physical Exam  Constitutional: She is oriented to person, place, and time. She appears well-developed and well-nourished. No distress.  HENT:  Head: Normocephalic and atraumatic.  Right Ear: Hearing, external ear and ear canal normal. No drainage or swelling. Tympanic membrane is erythematous (  moderate ). Tympanic membrane is not perforated. A middle ear effusion is present. No decreased hearing is noted.  Left Ear: Hearing, external ear and ear canal normal. No drainage or swelling. Tympanic membrane is erythematous (mild). Tympanic membrane is not perforated.  No middle ear effusion. No decreased hearing is noted.  Nose: Mucosal edema and rhinorrhea present. Right sinus exhibits no maxillary sinus tenderness and no frontal sinus tenderness. Left sinus exhibits no maxillary sinus tenderness and no frontal sinus tenderness.  Mouth/Throat: Uvula is midline, oropharynx is clear and moist and mucous membranes  are normal. No oropharyngeal exudate.  Eyes: Conjunctivae and EOM are normal. Pupils are equal, round, and reactive to light. Right eye exhibits no discharge. Left eye exhibits no discharge. No scleral icterus.  Neck: Trachea normal, normal range of motion, full passive range of motion without pain and phonation normal. Neck supple. No tracheal tenderness present. No tracheal deviation present. No Brudzinski's sign noted.  Cardiovascular: Normal rate, regular rhythm, normal heart sounds and intact distal pulses. Exam reveals no gallop and no friction rub.  No murmur heard. Pulmonary/Chest: Effort normal and breath sounds normal. No stridor. No respiratory distress. She has no wheezes. She has no rales. She exhibits no tenderness.  Abdominal: Soft.  Musculoskeletal: Normal range of motion.  Neurological: She is alert and oriented to person, place, and time. She has normal reflexes. Coordination normal.  Skin: Skin is warm and dry. No rash noted. She is not diaphoretic. No erythema. No pallor.  Psychiatric: She has a normal mood and affect. Her behavior is normal. Judgment and thought content normal.  Vitals reviewed.      Assessment:     Bilateral otitis media, unspecified otitis media type      Plan:     Meds ordered this encounter  Medications  . amoxicillin-clavulanate (AUGMENTIN) 875-125 MG tablet    Sig: Take 1 tablet 2 (two) times daily by mouth.    Dispense:  20 tablet    Refill:  0   Advised to return to clinic for an appointment if no improvement within 72 hours or if any symptoms change or worsen. Advised ER or urgent Care if after hours or on weekend. 911 for emergency symptoms at any time.  Patient verbalized understanding of all instructions given and denies any further questions at this time.

## 2017-11-22 DIAGNOSIS — K219 Gastro-esophageal reflux disease without esophagitis: Secondary | ICD-10-CM | POA: Insufficient documentation

## 2018-03-21 ENCOUNTER — Ambulatory Visit: Payer: BLUE CROSS/BLUE SHIELD | Admitting: Nurse Practitioner

## 2018-03-22 NOTE — Progress Notes (Signed)
[facility]  PATIENT: Nicole Bartlett DOB: [birth year]   REASON FOR VISIT: Follow-up for  headache/migraine, obstructive sleep apnea HISTORY FROM: Patient    HISTORY OF PRESENT ILLNESS:Nicole Bartlett, 52 year old female returns for followup, she has a long history of headaches for more than 25 years. She was diagnosed with obstructive sleep apnea and is suppose to be using  BiPAP.  She was made aware that having obstructive sleep apnea and not using BiPAP can make her headaches worse.   Her sleep physician is in [place] . She has chronic neck pain and most recent MRI of the neck without significant cervical spinal stenosis or foraminal narrowing.She also had an MRI of the brain 05/16/15 which was normal . She remains on   Topamax to 25mg  in the am and 50mg  at night. She continues to have migraines at most 4 per month. Her headaches last 30 minutes. Higher dose of Topamax caused her to have difficulty focusing.  Maxalt continues to work acutely. She also stopped her clonazepam due to difficulty focusing and slurring of speech.  She continues to work as  a Engineer, maintenance ([employer]).  She has history of chronic pain and is seeing pain management at [facility].  She has major depression and sees psychiatry.  She reports her mother was recently diagnosed with dementia.  She returns for reevaluation.  HISTORY: HAs occurring daily for almost 20 yrs- usually in back or front, dull, aching, not lateralizing by Dr. Doy Mince on 03/30/10. She has taken APAP and ibuprofen for HAs in past. Now on Topamax 50mg  twice daily. Headaches less intense, throb, no nausea, light or noise sensitivity and have decreased by 50%. HAs more intense w/ menses- rarely would wake at night- no assoc visual sx, dizziness, numbness.h/o neck fusion x2 (C6-7 2002, C5-6 2010)- She goes to the pain clinic for chronic back pain. She takes Maxalt rarely but it works acutely.    REVIEW OF SYSTEMS: Full 14 system review of systems  performed and notable only for those listed, all others are neg:  Constitutional: neg Cardiovascular: neg Ear/Nose/Throat: neg  Skin: neg Eyes: neg Respiratory: neg Gastroitestinal: neg  Hematology/Lymphatic: neg Endocrine: Intolerance to heat Musculoskeletal: Joint pain neck pain and back pain manageed of pain clinic Allergy/Immunology: Environmental Neurological: Headaches Psychiatric: Depression managed by psych Sleep : Obstructive sleep apnea with  BiPAP, currently not using followed by physician in [place]: Allergies  Allergen Reactions  . Sulfa Antibiotics Itching    and angioedema rash  . Bupropion Hives    Something in the compound drug.      HOME MEDICATIONS: Outpatient Medications Prior to Visit  Medication Sig Dispense Refill  . buPROPion (WELLBUTRIN SR) 200 MG 12 hr tablet Take 200 mg by mouth 2 (two) times daily.    Marland Kitchen DOXEPIN HCL PO Take 10 mg by mouth.    . DULoxetine (CYMBALTA) 30 MG capsule Take 30 mg by mouth every morning.  3  . DULoxetine (CYMBALTA) 60 MG capsule duloxetine 60 mg capsule,delayed release    . linaclotide (LINZESS) 290 MCG CAPS capsule Take by mouth.    . methocarbamol (ROBAXIN) 500 MG tablet Take 500 mg by mouth 4 (four) times daily.     Marland Kitchen oxyCODONE (OXYCONTIN) 20 mg 12 hr tablet OxyContin 20 mg tablet,crush resistant,extended release    . Oxycodone HCl 10 MG TABS Take 10 mg by mouth 3 (three) times daily.    . pantoprazole (PROTONIX) 40 MG tablet Take 40 mg  by mouth daily as needed.   5  . PENNSAID 2 % SOLN 2 PUMPS TWICE DAILY AS NEEDED  0  . rizatriptan (MAXALT) 10 MG tablet TAKE 1 TABLET BY MOUTH AS NEEDED FOR MIGRAINE. MAY REPEAT IN 2 HOURS    . topiramate (TOPAMAX) 25 MG tablet Take 1 tab po am, 2 tabs po pm. 270 tablet 2  . amoxicillin-clavulanate (AUGMENTIN) 875-125 MG tablet Take 1 tablet 2 (two) times daily by mouth. 20 tablet 0   No facility-administered medications prior to visit.     PAST MEDICAL  HISTORY: Past Medical History:  Diagnosis Date  . Anxiety and depression   . Chronic neck pain   . Fibromyalgia   . Narcotic dependence ([place])   . Tremor     PAST SURGICAL HISTORY: Past Surgical History:  Procedure Laterality Date  . C5/C6 fusion  2010  . C5/C7 fusion  2002  . CESAREAN SECTION     times 2  . GALLBLADDER SURGERY    . ROTATOR CUFF REPAIR Bilateral     FAMILY HISTORY: Family History  Problem Relation Age of Onset  . Cancer Mother   . Dementia Mother   . Diabetes Father   . Migraines Sister   . Fibroids Daughter     SOCIAL HISTORY: Social History   Socioeconomic History  . Marital status: Legally Separated    Spouse name: Not on file  . Number of children: 2  . Years of education: Not on file  . Highest education level: Not on file  Occupational History  . Occupation: Manufacturing engineer: [employer]  . Financial resource strain: Not on file  . Food insecurity:    Worry: Not on file    Inability: Not on file  . Transportation needs:    Medical: Not on file    Non-medical: Not on file  Tobacco Use  . Smoking status: Current Every Day Smoker    Packs/day: 1.00    Types: Cigarettes  . Smokeless tobacco: Never Used  Substance and Sexual Activity  . Alcohol use: Yes    Alcohol/week: 0.0 oz    Comment: rare  . Drug use: No  . Sexual activity: Not on file  Lifestyle  . Physical activity:    Days per week: Not on file    Minutes per session: Not on file  . Stress: Not on file  Relationships  . Social connections:    Talks on phone: Not on file    Gets together: Not on file    Attends religious service: Not on file    Active member of club or organization: Not on file    Attends meetings of clubs or organizations: Not on file    Relationship status: Not on file  . Intimate partner violence:    Fear of current or ex partner: Not on file    Emotionally abused: Not on file    Physically abused: Not on file    Forced sexual  activity: Not on file  Other Topics Concern  . Not on file  Social History Narrative   Patient is separated.   Patient has 2 children   Patient lives at home with daughter.   Patient drinks caffeine everyday until 5pm.           PHYSICAL EXAM  Vitals:   03/26/18 0831  BP: 125/81  Pulse: 81  Weight: 181 lb (82.1 kg)   Body mass index is 31.07 kg/m. Generalized:  Well developed, obese female in no acute distress  Head: normocephalic and atraumatic,. Oropharynx benign  Neck: Supple,  Musculoskeletal: No deformity   Neurological examination   Mentation: Alert oriented to time, place, history taking. Follows all commands speech and language fluent  Cranial nerve II-XII: Pupils were equal round reactive to light extraocular movements were full, visual field were full on confrontational test. Facial sensation and strength were normal. hearing was intact to finger rubbing bilaterally. Uvula tongue midline. head turning and shoulder shrug were normal and symmetric.Tongue protrusion into cheek strength was normal. Motor: normal bulk and tone, full strength in the BUE, BLE, fine finger movements normal, no pronator drift. No focal weakness Coordination: finger-nose-finger, heel-to-shin bilaterally, no dysmetria Reflexes: Symmetric upper and lower plantar responses were flexor bilaterally. Gait and Station: Rising up from seated position without assistance, normal stance, moderate stride, good arm swing, smooth turning, able to perform tiptoe, and heel walking without difficulty. Tandem gait is steady  DIAGNOSTIC DATA (LABS, IMAGING, TESTING) -ASSESSMENT AND PLAN 52 y.o. year old female has a past medical history of Anxiety and depression; Fibromyalgia; Chronic neck pain; Narcotic dependence; and headaches for over 25 years, essential tremor and obstructive sleep apnea here to follow-up.  She is unable to tolerate doses of Topamax higher than 75 due to inability to  Focus. She also  discontinued clonazepam due to some slurring of speech.  PLAN: Continue  Topamax 25 mg am and 50mg   daily will refill Continue Maxalt at current dose  Will refill Continue follow-up with [place] [facility] for hip pain Continue follow-up with psychiatry for depression Call for worsening of headaches Avoid migraine triggers Follow-up in 8 months I spent 42min in total face to face time with the patient more than 50% of which was spent counseling and coordination of care, reviewing test results reviewing medications and discussing and reviewing the diagnosis of chronic migraine and further treatment options.  Dennie Bible, [facility], APRN  [facility] [address], [place], [place] [address] [phone]

## 2018-03-26 ENCOUNTER — Ambulatory Visit: Payer: BLUE CROSS/BLUE SHIELD | Admitting: Nurse Practitioner

## 2018-03-26 ENCOUNTER — Encounter: Payer: Self-pay | Admitting: Nurse Practitioner

## 2018-03-26 VITALS — BP 125/81 | HR 81 | Wt 181.0 lb

## 2018-03-26 DIAGNOSIS — G43909 Migraine, unspecified, not intractable, without status migrainosus: Secondary | ICD-10-CM

## 2018-03-26 DIAGNOSIS — R519 Headache, unspecified: Secondary | ICD-10-CM

## 2018-03-26 DIAGNOSIS — R51 Headache: Secondary | ICD-10-CM | POA: Diagnosis not present

## 2018-03-26 MED ORDER — RIZATRIPTAN BENZOATE 10 MG PO TABS: ORAL_TABLET | ORAL | 1 refills | Status: DC

## 2018-03-26 MED ORDER — TOPIRAMATE 25 MG PO TABS: ORAL_TABLET | ORAL | 2 refills | Status: DC

## 2018-03-26 NOTE — Progress Notes (Signed)
I have read the note, and I agree with the clinical assessment and plan.  Nicole Bartlett   

## 2018-03-26 NOTE — Patient Instructions (Signed)
Continue  Topamax 25 mg am and 50mg   daily will refill Continue Maxalt at current dose  Will refill Continue follow-up with [facility] [facility] for hip pain Continue follow-up with psychiatry for depression Call for worsening of headaches Avoid migraine triggers Follow-up in 8 months

## 2018-06-18 ENCOUNTER — Other Ambulatory Visit: Payer: Self-pay | Admitting: Nurse Practitioner

## 2018-06-19 ENCOUNTER — Encounter: Payer: Self-pay | Admitting: Nurse Practitioner

## 2018-06-28 ENCOUNTER — Other Ambulatory Visit: Payer: Self-pay

## 2018-06-28 MED ORDER — RIZATRIPTAN BENZOATE 10 MG PO TABS: ORAL_TABLET | ORAL | 1 refills | Status: DC

## 2018-06-28 NOTE — Telephone Encounter (Signed)
Refill for Rizatriptan submitted to [facility] in [place] via e-script 30 tabs with 1 refill. MB RN

## 2018-08-29 DIAGNOSIS — G5600 Carpal tunnel syndrome, unspecified upper limb: Secondary | ICD-10-CM | POA: Insufficient documentation

## 2018-09-26 DIAGNOSIS — M6530 Trigger finger, unspecified finger: Secondary | ICD-10-CM | POA: Insufficient documentation

## 2018-11-06 DIAGNOSIS — M79642 Pain in left hand: Secondary | ICD-10-CM | POA: Insufficient documentation

## 2018-11-28 ENCOUNTER — Telehealth: Payer: Self-pay | Admitting: *Deleted

## 2018-11-28 ENCOUNTER — Ambulatory Visit: Payer: BLUE CROSS/BLUE SHIELD | Admitting: Nurse Practitioner

## 2018-11-28 NOTE — Telephone Encounter (Signed)
LVM advising the patient that her FU on 12/03/18 needs to be rescheduled; the provider will be out of the office. Advised she call back and have phone staff reschedule the follow up, and advised she can be put on wait list for sooner if she prefers.

## 2018-11-29 NOTE — Telephone Encounter (Signed)
R/s pt for 01/15/19 with NP, stating that if another early morning appt comes available she would like a call. FYI

## 2018-12-03 ENCOUNTER — Ambulatory Visit: Payer: BLUE CROSS/BLUE SHIELD | Admitting: Nurse Practitioner

## 2018-12-25 DIAGNOSIS — M659 Synovitis and tenosynovitis, unspecified: Secondary | ICD-10-CM | POA: Insufficient documentation

## 2018-12-29 ENCOUNTER — Other Ambulatory Visit: Payer: Self-pay | Admitting: Nurse Practitioner

## 2019-01-14 NOTE — Progress Notes (Signed)
[facility]  PATIENT: Nicole Bartlett DOB: Sep 19, 1966   REASON FOR VISIT: Follow-up for  headache/migraine, obstructive sleep apnea HISTORY FROM: Patient    HISTORY OF PRESENT ILLNESS:UPDATE 2/25/2020CM [name], 53 year old female returns for follow-up with a long history of headaches for more than 26 years.  She also has obstructive sleep apnea but does not use her BiPAP.  Her headaches are generally due to weather changes and stress.  She is in the process of trying to sell her home and building a home at the same time.  She had recent changes to her medications by psychiatry.  She is now on Depakote ER 500 daily.  She was made aware that this is also a migraine preventive.  She remains on Topamax 25 1 in the morning and 2 at night.  She has not been able to tolerate higher doses due to side effects.  Her current headaches are managed with Maxalt acutely.  She also has a history of chronic pain and is managed by [facility] with narcotics.  She has major depression and is seen by the [facility].  She returns for reevaluation     5/6/19CM [name], 53 year old female returns for followup, she has a long history of headaches for more than 25 years. She was diagnosed with obstructive sleep apnea and is suppose to be using  BiPAP.  She was made aware that having obstructive sleep apnea and not using BiPAP can make her headaches worse.   Her sleep physician is in [place] . She has chronic neck pain and most recent MRI of the neck without significant cervical spinal stenosis or foraminal narrowing.She also had an MRI of the brain 05/16/15 which was normal . She remains on   Topamax to 25mg  in the am and 50mg  at night. She continues to have migraines at most 4 per month. Her headaches last 30 minutes. Higher dose of Topamax caused her to have difficulty focusing.  Maxalt continues to work acutely. She also stopped her clonazepam due to difficulty focusing and  slurring of speech.  She continues to work as  a Engineer, maintenance (IT).  She has history of chronic pain and is seeing pain management at [facility].  She has major depression and sees psychiatry.  She reports her mother was recently diagnosed with dementia.  She returns for reevaluation.  HISTORY: HAs occurring daily for almost 20 yrs- usually in back or front, dull, aching, not lateralizing by Dr. Doy Mince on 03/30/10. She has taken APAP and ibuprofen for HAs in past. Now on Topamax 50mg  twice daily. Headaches less intense, throb, no nausea, light or noise sensitivity and have decreased by 50%. HAs more intense w/ menses- rarely would wake at night- no assoc visual sx, dizziness, numbness.h/o neck fusion x2 (C6-7 2002, C5-6 2010)- She goes to the pain clinic for chronic back pain. She takes Maxalt rarely but it works acutely.    REVIEW OF SYSTEMS: Full 14 system review of systems performed and notable only for those listed, all others are neg:  Constitutional: neg Cardiovascular: neg Ear/Nose/Throat: neg  Skin: neg Eyes: Light sensitivity Respiratory: neg Gastroitestinal: neg  Hematology/Lymphatic: Easy bruising Endocrine: Intolerance to heat Musculoskeletal: Joint pain neck pain and back pain manageed by pain clinic Allergy/Immunology: Environmental Neurological: Headaches Psychiatric: Depression managed by psych Sleep : Obstructive sleep apnea with  BiPAP, currently not using followed by physician in [place]: Allergies  Allergen Reactions  . Sulfa Antibiotics Itching  and angioedema rash  . Bupropion Hives    Something in the compound drug.  (Zyban)    HOME MEDICATIONS: Outpatient Medications Prior to Visit  Medication Sig Dispense Refill  . buPROPion (WELLBUTRIN XL) 300 MG 24 hr tablet Take 300 mg by mouth daily.    Hendricks Limes CONTINUING MONTH PAK 1 MG tablet Take 1 mg by mouth 2 (two) times daily.     . divalproex (DEPAKOTE ER) 500 MG 24 hr tablet Take 500 mg by  mouth daily.     Marland Kitchen doxepin (SINEQUAN) 50 MG capsule Take 50 mg by mouth at bedtime.    . DULoxetine (CYMBALTA) 30 MG capsule Take 30 mg by mouth every morning.  3  . linaclotide (LINZESS) 290 MCG CAPS capsule Take by mouth.    . methocarbamol (ROBAXIN) 500 MG tablet Take 500 mg by mouth 4 (four) times daily.     Marland Kitchen oxyCODONE (OXYCONTIN) 20 mg 12 hr tablet OxyContin 20 mg tablet,crush resistant,extended release    . Oxycodone HCl 10 MG TABS Take 10 mg by mouth 3 (three) times daily.    . pantoprazole (PROTONIX) 40 MG tablet Take 40 mg by mouth daily as needed.   5  . PENNSAID 2 % SOLN 2 PUMPS TWICE DAILY AS NEEDED  0  . REXULTI 2 MG TABS at bedtime.     . rizatriptan (MAXALT) 10 MG tablet TAKE 1 TABLET BY MOUTH AS NEEDED FOR MIGRAINE. MAY REPEAT IN 2 HOURS 30 tablet 1  . topiramate (TOPAMAX) 25 MG tablet Take 1 tab po am, 2 tabs po pm. 270 tablet 2  . TRINTELLIX 20 MG TABS tablet Take 20 mg by mouth daily.     Marland Kitchen VYVANSE 70 MG capsule Take 70 mg by mouth daily.     Marland Kitchen buPROPion (WELLBUTRIN SR) 200 MG 12 hr tablet Take 200 mg by mouth 2 (two) times daily.    Marland Kitchen DOXEPIN HCL PO Take 10 mg by mouth.    . DULoxetine (CYMBALTA) 60 MG capsule duloxetine 60 mg capsule,delayed release     No facility-administered medications prior to visit.     PAST MEDICAL HISTORY: Past Medical History:  Diagnosis Date  . Anxiety and depression   . Chronic neck pain   . Fibromyalgia   . Narcotic dependence ([place])   . Tremor     PAST SURGICAL HISTORY: Past Surgical History:  Procedure Laterality Date  . C5/C6 fusion  2010  . C5/C7 fusion  2002  . CESAREAN SECTION     times 2  . GALLBLADDER SURGERY    . ROTATOR CUFF REPAIR Bilateral     FAMILY HISTORY: Family History  Problem Relation Age of Onset  . Cancer Mother   . Dementia Mother   . Diabetes Father   . Migraines Sister   . Fibroids Daughter     SOCIAL HISTORY: Social History   Socioeconomic History  . Marital status: Legally Separated     Spouse name: Not on file  . Number of children: 2  . Years of education: Not on file  . Highest education level: Not on file  Occupational History  . Occupation: Manufacturing engineer: [employer]  . Financial resource strain: Not on file  . Food insecurity:    Worry: Not on file    Inability: Not on file  . Transportation needs:    Medical: Not on file    Non-medical: Not on file  Tobacco Use  .  Smoking status: Current Every Day Smoker    Packs/day: 1.00    Types: Cigarettes  . Smokeless tobacco: Never Used  Substance and Sexual Activity  . Alcohol use: Yes    Alcohol/week: 0.0 standard drinks    Comment: rare  . Drug use: No  . Sexual activity: Not on file  Lifestyle  . Physical activity:    Days per week: Not on file    Minutes per session: Not on file  . Stress: Not on file  Relationships  . Social connections:    Talks on phone: Not on file    Gets together: Not on file    Attends religious service: Not on file    Active member of club or organization: Not on file    Attends meetings of clubs or organizations: Not on file    Relationship status: Not on file  . Intimate partner violence:    Fear of current or ex partner: Not on file    Emotionally abused: Not on file    Physically abused: Not on file    Forced sexual activity: Not on file  Other Topics Concern  . Not on file  Social History Narrative   Patient is separated.   Patient has 2 children   Patient lives at home with daughter.   Patient drinks caffeine everyday until 5pm.           PHYSICAL EXAM  Vitals:   01/15/19 0901  BP: 133/86  Pulse: 88  Weight: 211 lb 12.8 oz (96.1 kg)  Height: 5\' 4"  (1.626 m)   Body mass index is 36.36 kg/m. Generalized: Well developed, obese female in no acute distress  Head: normocephalic and atraumatic,. Oropharynx benign  Neck: Supple,  Musculoskeletal: No deformity   Neurological examination   Mentation: Alert oriented to time, place,  history taking. Follows all commands speech and language fluent  Cranial nerve II-XII: Pupils were equal round reactive to light extraocular movements were full, visual field were full on confrontational test. Facial sensation and strength were normal. hearing was intact to finger rubbing bilaterally. Uvula tongue midline. head turning and shoulder shrug were normal and symmetric.Tongue protrusion into cheek strength was normal. Motor: normal bulk and tone, full strength in the BUE, BLE, fine finger movements normal, no pronator drift. No focal weakness Coordination: finger-nose-finger, heel-to-shin bilaterally, no dysmetria Reflexes: Symmetric upper and lower plantar responses were flexor bilaterally. Gait and Station: Rising up from seated position without assistance, normal stance, moderate stride, good arm swing, smooth turning, able to perform tiptoe, and heel walking without difficulty. Tandem gait is steady  DIAGNOSTIC DATA (LABS, IMAGING, TESTING) -ASSESSMENT AND PLAN 53 y.o. year old female has a past medical history of Anxiety and depression; Fibromyalgia; Chronic neck pain; Narcotic dependence; and headaches for over 26 years, essential tremor and obstructive sleep apnea here to follow-up.  She is unable to tolerate doses of Topamax higher than 75 due to inability to  Focus. She also discontinued clonazepam due to some slurring of speech.  Recently placed on Depakote ER 500 mg by psych.  Patient was made aware of this is also a migraine preventive.  Maxalt continues to work acutely.  She is supposed to be titrating off of Cymbalta which can also cause headache.  PLAN: Continue  Topamax 25 mg am and 50mg   daily will refill Continue Maxalt at current dose   Continue follow-up with psychiatry for depression Depakote 500mg  ER recently added by psych Discuss discontinuing cymbalta with psych  Call for worsening of headaches Avoid migraine triggers Follow-up in 6 months I spent 63min in  total face to face time with the patient more than 50% of which was spent counseling and coordination of care, reviewing test results reviewing medications and discussing and reviewing the diagnosis of chronic migraine and further treatment options.  Dennie Bible, [facility], [facility], APRN  [facility] [address] [place], [place] [address] [phone]

## 2019-01-15 ENCOUNTER — Encounter: Payer: Self-pay | Admitting: Nurse Practitioner

## 2019-01-15 ENCOUNTER — Encounter

## 2019-01-15 ENCOUNTER — Ambulatory Visit: Payer: BLUE CROSS/BLUE SHIELD | Admitting: Nurse Practitioner

## 2019-01-15 VITALS — BP 133/86 | HR 88 | Ht 64.0 in | Wt 211.8 lb

## 2019-01-15 DIAGNOSIS — R51 Headache: Secondary | ICD-10-CM | POA: Diagnosis not present

## 2019-01-15 DIAGNOSIS — G894 Chronic pain syndrome: Secondary | ICD-10-CM

## 2019-01-15 DIAGNOSIS — Z9114 Patient's other noncompliance with medication regimen: Secondary | ICD-10-CM | POA: Diagnosis not present

## 2019-01-15 DIAGNOSIS — R519 Headache, unspecified: Secondary | ICD-10-CM

## 2019-01-15 MED ORDER — RIZATRIPTAN BENZOATE 10 MG PO TABS: ORAL_TABLET | ORAL | 1 refills | Status: DC

## 2019-01-15 MED ORDER — TOPIRAMATE 25 MG PO TABS: ORAL_TABLET | ORAL | 2 refills | Status: DC

## 2019-01-15 NOTE — Patient Instructions (Addendum)
Continue  Topamax 25 mg am and 50mg   daily will refill Continue Maxalt at current dose   Continue follow-up with psychiatry for depression Depakote 500mg  ER recently added by psych this is a migraine preventive also Discuss discontinuing cymbalta with psych  Call for worsening of headaches Avoid migraine triggers Follow-up in 6 months

## 2019-01-15 NOTE — Progress Notes (Signed)
I have read the note, and I agree with the clinical assessment and plan.  Ceferino Lang K Raymund Manrique   

## 2019-06-13 NOTE — H&P (Signed)
Patient's anticipated LOS is less than 2 midnights, meeting these requirements: - Younger than 55 - Lives within 1 hour of care - Has a competent adult at home to recover with post-op recover - NO history of  - Chronic pain requiring opiods  - Diabetes  - Coronary Artery Disease  - Heart failure  - Heart attack  - Stroke  - DVT/VTE  - Cardiac arrhythmia  - Respiratory Failure/COPD  - Renal failure  - Anemia  - Advanced Liver disease       Nicole Bartlett is an 53 y.o. female.    Chief Complaint: right shoulder pain  HPI: Pt is a 53 y.o. female complaining of right shoulder pain for multiple years. Pain had continually increased since the beginning. X-rays in the clinic show end-stage arthritic changes of the right shoulder. Pt has tried various conservative treatments which have failed to alleviate their symptoms, including injections and therapy. Various options are discussed with the patient. Risks, benefits and expectations were discussed with the patient. Patient understand the risks, benefits and expectations and wishes to proceed with surgery.   PCP:  Jamesetta Geralds, MD  D/C Plans: Home  PMH: Past Medical History:  Diagnosis Date  . Anxiety and depression   . Chronic neck pain   . Fibromyalgia   . Narcotic dependence ([place])   . Tremor     PSH: Past Surgical History:  Procedure Laterality Date  . C5/C6 fusion  2010  . C5/C7 fusion  2002  . CESAREAN SECTION     times 2  . GALLBLADDER SURGERY    . ROTATOR CUFF REPAIR Bilateral     Social History:  reports that she has been smoking cigarettes. She has been smoking about 1.00 pack per day. She has never used smokeless tobacco. She reports current alcohol use. She reports that she does not use drugs.  Allergies:  Allergies  Allergen Reactions  . Sulfa Antibiotics Itching    and angioedema rash  . Bupropion Hives    Something in the compound drug.  (Zyban)    Medications: No current  facility-administered medications for this encounter.    Current Outpatient Medications  Medication Sig Dispense Refill  . buPROPion (WELLBUTRIN XL) 300 MG 24 hr tablet Take 300 mg by mouth daily.    Hendricks Limes CONTINUING MONTH PAK 1 MG tablet Take 1 mg by mouth 2 (two) times daily.     . divalproex (DEPAKOTE ER) 500 MG 24 hr tablet Take 500 mg by mouth daily.     Marland Kitchen doxepin (SINEQUAN) 50 MG capsule Take 50 mg by mouth at bedtime.    . DULoxetine (CYMBALTA) 30 MG capsule Take 30 mg by mouth every morning.  3  . linaclotide (LINZESS) 290 MCG CAPS capsule Take by mouth.    . methocarbamol (ROBAXIN) 500 MG tablet Take 500 mg by mouth 4 (four) times daily.     Marland Kitchen oxyCODONE (OXYCONTIN) 20 mg 12 hr tablet OxyContin 20 mg tablet,crush resistant,extended release    . Oxycodone HCl 10 MG TABS Take 10 mg by mouth 3 (three) times daily.    . pantoprazole (PROTONIX) 40 MG tablet Take 40 mg by mouth daily as needed.   5  . PENNSAID 2 % SOLN 2 PUMPS TWICE DAILY AS NEEDED  0  . REXULTI 2 MG TABS at bedtime.     . rizatriptan (MAXALT) 10 MG tablet May repeat in 2 hours if needed 30 tablet 1  . topiramate (TOPAMAX) 25 MG tablet  Take 1 tab po am, 2 tabs po pm. 270 tablet 2  . TRINTELLIX 20 MG TABS tablet Take 20 mg by mouth daily.     Marland Kitchen VYVANSE 70 MG capsule Take 70 mg by mouth daily.       No results found for this or any previous visit (from the past 48 hour(s)). No results found.  ROS: Pain with rom of the right upper extremity  Physical Exam: Alert and oriented 53 y.o. female in no acute distress Cranial nerves 2-12 intact Cervical spine: full rom with no tenderness, nv intact distally Chest: active breath sounds bilaterally, no wheeze rhonchi or rales Heart: regular rate and rhythm, no murmur Abd: non tender non distended with active bowel sounds Hip is stable with rom  Right shoulder pain and crepitus with rom rom moderately limited nv intact distally No rashes or edema  Assessment/Plan  Assessment: right shoulder cuff arthropathy  Plan:  Patient will undergo a right reverse total shoulder by Dr. Veverly Fells at [facility]. Risks benefits and expectations were discussed with the patient. Patient understand risks, benefits and expectations and wishes to proceed. Preoperative templating of the joint replacement has been completed, documented, and submitted to the Operating Room personnel in order to optimize intra-operative equipment management.   [name] PA-C, MPAS [facility] is now [facility] [address] [place], [place], [place] [address] Phone: [phone] [email or web address] Facebook  [employer]

## 2019-06-25 ENCOUNTER — Other Ambulatory Visit (HOSPITAL_COMMUNITY)
Admission: RE | Admit: 2019-06-25 | Discharge: 2019-06-25 | Disposition: A | Discharge status: Home / Self Care | Payer: BC Managed Care – PPO | Source: Ambulatory Visit | Attending: Orthopedic Surgery | Admitting: Orthopedic Surgery

## 2019-06-25 DIAGNOSIS — Z20828 Contact with and (suspected) exposure to other viral communicable diseases: Secondary | ICD-10-CM | POA: Diagnosis not present

## 2019-06-25 DIAGNOSIS — M19011 Primary osteoarthritis, right shoulder: Secondary | ICD-10-CM | POA: Insufficient documentation

## 2019-06-25 DIAGNOSIS — Z01812 Encounter for preprocedural laboratory examination: Secondary | ICD-10-CM | POA: Insufficient documentation

## 2019-06-25 LAB — SARS CORONAVIRUS 2 (TAT 6-24 HRS): SARS Coronavirus 2: NEGATIVE

## 2019-06-25 NOTE — Patient Instructions (Signed)
YOU NEED TO HAVE A COVID 19 TEST ON__Tuesday 8/4_____ @__12 :55_____,  THIS TEST MUST BE DONE BEFORE SURGERY, COME  [place] , [address]. ONCE YOUR COVID TEST IS COMPLETED,  PLEASE BEGIN THE QUARANTINE INSTRUCTIONS AS OUTLINED IN YOUR HANDOUT.                KIMORA STANKOVIC   Your procedure is scheduled AJ:OINOMV 06/28/19    Report to [facility] Main  Entrance Report to admitting at 10:50 AM   1 VISITOR IS ALLOWED TO WAIT IN WAITING ROOM  ONLY DAY OF YOUR SURGERY.   No family in Short Stay at this time   Call this number if you have problems the morning of surgery [phone]   Do not eat food After Midnight.   YOU MAY HAVE CLEAR LIQUIDS FROM MIDNIGHT UNTIL 10:15 AM    CLEAR LIQUID DIET   Foods Allowed                                                                     Foods Excluded  Coffee and tea, regular and decaf                             liquids that you cannot  Plain Jell-O any favor except red or purple                                           see through such as: Fruit ices (not with fruit pulp)                                     milk, soups, orange juice  Iced Popsicles                                    All solid food Carbonated beverages, regular and diet                                    Cranberry, grape and apple juices Sports drinks like Gatorade Lightly seasoned clear broth or consume(fat free) Sugar, honey syrup  Sample Menu Breakfast                                Lunch                                     Supper Cranberry juice                    Beef broth                            Chicken broth Jell-O  Grape juice                           Apple juice Coffee or tea                        Jell-O                                      Popsicle                                                Coffee or tea                        Coffee or  tea  _____________________________________________________________________   . At 10:15 AM Please finish the prescribed Pre-Surgery  Drink . Nothing by mouth after you finish the  drink !   . BRUSH YOUR TEETH MORNING OF SURGERY AND RINSE YOUR MOUTH OUT, NO CHEWING GUM CANDY OR MINTS.     Take these medicines the morning of surgery with A SIP OF WATER: Topamax, Wellbutrin,Trintellix,Abilifi, Rexuiti                                 You may not have any metal on your body including hair pins and              piercings              Do not wear jewelry, make-up, lotions, powders or perfumes, deodorant             Do not wear nail polish.  Do not shave  48 hours prior to surgery.               Do not bring valuables to the hospital. [place].  Contacts, dentures or bridgework may not be worn into surgery.        Name and phone number of your driver:     [facility] - Preparing for Surgery  Before surgery, you can play an important role.   Because skin is not sterile, your skin needs to be as free of germs as possible.   You can reduce the number of germs on your skin by washing with CHG (chlorahexidine gluconate) soap before surgery.   CHG is an antiseptic cleaner which kills germs and bonds with the skin to continue killing germs even after washing. Please DO NOT use if you have an allergy to CHG or antibacterial soaps.   If your skin becomes reddened/irritated stop using the CHG and inform your nurse when you arrive at Short Stay. Do not shave (including legs and underarms) for at least 48 hours prior to the first CHG shower.  Please follow these instructions carefully:  1.  Shower with CHG Soap the night before surgery and the  morning of Surgery.  2.  If you choose to wash your hair, wash your hair first as usual with your  normal  shampoo.  3.  After you shampoo, rinse your hair  and body thoroughly to remove the  shampoo.                                         4.  Use CHG as you would any other liquid soap.  You can apply chg directly  to the skin and wash                       Gently with a scrungie or clean washcloth.  5.  Apply the CHG Soap to your body ONLY FROM THE NECK DOWN.   Do not use on face/ open                           Wound or open sores. Avoid contact with eyes, ears mouth and genitals (private parts).                       Wash face,  Genitals (private parts) with your normal soap.             6.  Wash thoroughly, paying special attention to the area where your surgery  will be performed.  7.  Thoroughly rinse your body with warm water from the neck down.  8.  DO NOT shower/wash with your normal soap after using and rinsing off  the CHG Soap.             9.  Pat yourself dry with a clean towel.            10.  Wear clean pajamas.            11.  Place clean sheets on your bed the night of your first shower and do not  sleep with pets. Day of Surgery : Do not apply any lotions/deodorants the morning of surgery.  Please wear clean clothes to the hospital/surgery center.  FAILURE TO FOLLOW THESE INSTRUCTIONS MAY RESULT IN THE CANCELLATION OF YOUR SURGERY PATIENT SIGNATURE_________________________________  NURSE SIGNATURE__________________________________  ________________________________________________________________________   Adam Phenix  An incentive spirometer is a tool that can help keep your lungs clear and active. This tool measures how well you are filling your lungs with each breath. Taking long deep breaths may help reverse or decrease the chance of developing breathing (pulmonary) problems (especially infection) following:  A long period of time when you are unable to move or be active. BEFORE THE PROCEDURE   If the spirometer includes an indicator to show your best effort, your nurse or respiratory therapist will set it to a desired goal.  If possible, sit up straight or  lean slightly forward. Try not to slouch.  Hold the incentive spirometer in an upright position. INSTRUCTIONS FOR USE  1. Sit on the edge of your bed if possible, or sit up as far as you can in bed or on a chair. 2. Hold the incentive spirometer in an upright position. 3. Breathe out normally. 4. Place the mouthpiece in your mouth and seal your lips tightly around it. 5. Breathe in slowly and as deeply as possible, raising the piston or the ball toward the top of the column. 6. Hold your breath for 3-5 seconds or for as long as possible. Allow the piston or ball to fall to the bottom of the column. 7. Remove the mouthpiece from your mouth and breathe out  normally. 8. Rest for a few seconds and repeat Steps 1 through 7 at least 10 times every 1-2 hours when you are awake. Take your time and take a few normal breaths between deep breaths. 9. The spirometer may include an indicator to show your best effort. Use the indicator as a goal to work toward during each repetition. 10. After each set of 10 deep breaths, practice coughing to be sure your lungs are clear. If you have an incision (the cut made at the time of surgery), support your incision when coughing by placing a pillow or rolled up towels firmly against it. Once you are able to get out of bed, walk around indoors and cough well. You may stop using the incentive spirometer when instructed by your caregiver.  RISKS AND COMPLICATIONS  Take your time so you do not get dizzy or light-headed.  If you are in pain, you may need to take or ask for pain medication before doing incentive spirometry. It is harder to take a deep breath if you are having pain. AFTER USE  Rest and breathe slowly and easily.  It can be helpful to keep track of a log of your progress. Your caregiver can provide you with a simple table to help with this. If you are using the spirometer at home, follow these instructions: [place] IF:   You are having  difficultly using the spirometer.  You have trouble using the spirometer as often as instructed.  Your pain medication is not giving enough relief while using the spirometer.  You develop fever of 100.5 F (38.1 C) or higher. SEEK IMMEDIATE MEDICAL CARE IF:   You cough up bloody sputum that had not been present before.  You develop fever of 102 F (38.9 C) or greater.  You develop worsening pain at or near the incision site. MAKE SURE YOU:   Understand these instructions.  Will watch your condition.  Will get help right away if you are not doing well or get worse. Document Released: 03/20/2007 Document Revised: 01/30/2012 Document Reviewed: 05/21/2007 [facility] Patient Information 2014 [place], [place].

## 2019-06-26 ENCOUNTER — Other Ambulatory Visit: Payer: Self-pay

## 2019-06-26 ENCOUNTER — Encounter (HOSPITAL_COMMUNITY): Payer: Self-pay

## 2019-06-26 ENCOUNTER — Encounter (HOSPITAL_COMMUNITY)
Admission: RE | Admit: 2019-06-26 | Discharge: 2019-06-26 | Disposition: A | Discharge status: Home / Self Care | Payer: BC Managed Care – PPO | Source: Ambulatory Visit | Attending: Orthopedic Surgery | Admitting: Orthopedic Surgery

## 2019-06-26 DIAGNOSIS — M19011 Primary osteoarthritis, right shoulder: Secondary | ICD-10-CM | POA: Insufficient documentation

## 2019-06-26 DIAGNOSIS — Z01812 Encounter for preprocedural laboratory examination: Secondary | ICD-10-CM | POA: Diagnosis present

## 2019-06-26 HISTORY — DX: Gastro-esophageal reflux disease without esophagitis: K21.9

## 2019-06-26 HISTORY — DX: Sleep apnea, unspecified: G47.30

## 2019-06-26 HISTORY — DX: Headache, unspecified: R51.9

## 2019-06-26 LAB — CBC
HCT: 40.1 % (ref 36.0–46.0)
Hemoglobin: 12.3 g/dL (ref 12.0–15.0)
MCH: 29.4 pg (ref 26.0–34.0)
MCHC: 30.7 g/dL (ref 30.0–36.0)
MCV: 95.7 fL (ref 80.0–100.0)
Platelets: 233 K/uL (ref 150–400)
RBC: 4.19 MIL/uL (ref 3.87–5.11)
RDW: 13.2 % (ref 11.5–15.5)
WBC: 5.7 K/uL (ref 4.0–10.5)
nRBC: 0 % (ref 0.0–0.2)

## 2019-06-26 LAB — SURGICAL PCR SCREEN
MRSA, PCR: NEGATIVE
Staphylococcus aureus: POSITIVE — AB

## 2019-06-26 LAB — BASIC METABOLIC PANEL WITH GFR
Anion gap: 8 (ref 5–15)
BUN: 11 mg/dL (ref 6–20)
CO2: 29 mmol/L (ref 22–32)
Calcium: 9.3 mg/dL (ref 8.9–10.3)
Chloride: 102 mmol/L (ref 98–111)
Creatinine, Ser: 0.7 mg/dL (ref 0.44–1.00)
GFR calc Af Amer: >60 mL/min
GFR calc non Af Amer: >60 mL/min
Glucose, Bld: 106 mg/dL — ABNORMAL HIGH (ref 70–99)
Potassium: 4.2 mmol/L (ref 3.5–5.1)
Sodium: 139 mmol/L (ref 135–145)

## 2019-06-27 ENCOUNTER — Telehealth (HOSPITAL_COMMUNITY): Payer: Self-pay | Admitting: *Deleted

## 2019-06-27 NOTE — Anesthesia Preprocedure Evaluation (Addendum)
Anesthesia Evaluation  Patient identified by MRN, date of birth, ID band Patient awake    Reviewed: Allergy & Precautions, NPO status , Patient's Chart, lab work & pertinent test results  History of Anesthesia Complications Negative for: history of anesthetic complications  Airway Mallampati: III  TM Distance: >3 FB Neck ROM: Full    Dental  (+) Dental Advisory Given, Chipped   Pulmonary sleep apnea , Current SmokerPatient did not abstain from smoking.,    Pulmonary exam normal        Cardiovascular Exercise Tolerance: Good (-) anginanegative cardio ROS Normal cardiovascular exam     Neuro/Psych  Headaches, PSYCHIATRIC DISORDERS Anxiety Depression  Hx of chronic neck pain  Neuromuscular disease    GI/Hepatic Neg liver ROS, GERD  Medicated and Controlled,  Endo/Other   Obesity   Renal/GU negative Renal ROS     Musculoskeletal  (+) Fibromyalgia -, narcotic dependent  Abdominal (+) + obese,   Peds  Hematology negative hematology ROS (+)   Anesthesia Other Findings   Reproductive/Obstetrics                           Anesthesia Physical Anesthesia Plan  ASA: III  Anesthesia Plan: General   Post-op Pain Management:  Regional for Post-op pain   Induction: Intravenous  PONV Risk Score and Plan: 3 and Treatment may vary due to age or medical condition, Ondansetron, Midazolam, Promethazine and Dexamethasone  Airway Management Planned: Oral ETT and Video Laryngoscope Planned  Additional Equipment: None  Intra-op Plan:   Post-operative Plan: Extubation in OR  Informed Consent: I have reviewed the patients History and Physical, chart, labs and discussed the procedure including the risks, benefits and alternatives for the proposed anesthesia with the patient or authorized representative who has indicated his/her understanding and acceptance.     Dental advisory given  Plan Discussed  with: CRNA and Anesthesiologist  Anesthesia Plan Comments: (R [id number]) w GA)      Anesthesia Quick Evaluation

## 2019-06-28 ENCOUNTER — Encounter (HOSPITAL_COMMUNITY): Payer: Self-pay | Admitting: *Deleted

## 2019-06-28 ENCOUNTER — Encounter (HOSPITAL_COMMUNITY)
Admission: RE | Disposition: A | Discharge status: Home / Self Care | Payer: Self-pay | Source: Home / Self Care | Attending: Orthopedic Surgery

## 2019-06-28 ENCOUNTER — Other Ambulatory Visit: Payer: Self-pay

## 2019-06-28 ENCOUNTER — Inpatient Hospital Stay (HOSPITAL_COMMUNITY)
Admission: RE | Admit: 2019-06-28 | Discharge: 2019-06-29 | DRG: 483 | Disposition: A | Discharge status: Home / Self Care | Payer: BC Managed Care – PPO | Attending: Orthopedic Surgery | Admitting: Orthopedic Surgery

## 2019-06-28 ENCOUNTER — Inpatient Hospital Stay (HOSPITAL_COMMUNITY): Payer: BC Managed Care – PPO | Admitting: Physician Assistant

## 2019-06-28 ENCOUNTER — Inpatient Hospital Stay (HOSPITAL_COMMUNITY): Payer: BC Managed Care – PPO

## 2019-06-28 ENCOUNTER — Inpatient Hospital Stay (HOSPITAL_COMMUNITY): Payer: BC Managed Care – PPO | Admitting: Anesthesiology

## 2019-06-28 DIAGNOSIS — Z882 Allergy status to sulfonamides status: Secondary | ICD-10-CM

## 2019-06-28 DIAGNOSIS — F1120 Opioid dependence, uncomplicated: Secondary | ICD-10-CM | POA: Diagnosis present

## 2019-06-28 DIAGNOSIS — Z888 Allergy status to other drugs, medicaments and biological substances status: Secondary | ICD-10-CM | POA: Diagnosis not present

## 2019-06-28 DIAGNOSIS — M19011 Primary osteoarthritis, right shoulder: Secondary | ICD-10-CM | POA: Diagnosis present

## 2019-06-28 DIAGNOSIS — F329 Major depressive disorder, single episode, unspecified: Secondary | ICD-10-CM | POA: Diagnosis present

## 2019-06-28 DIAGNOSIS — Z981 Arthrodesis status: Secondary | ICD-10-CM | POA: Diagnosis not present

## 2019-06-28 DIAGNOSIS — M797 Fibromyalgia: Secondary | ICD-10-CM | POA: Diagnosis present

## 2019-06-28 DIAGNOSIS — G43909 Migraine, unspecified, not intractable, without status migrainosus: Secondary | ICD-10-CM | POA: Diagnosis present

## 2019-06-28 DIAGNOSIS — R251 Tremor, unspecified: Secondary | ICD-10-CM | POA: Diagnosis present

## 2019-06-28 DIAGNOSIS — M75101 Unspecified rotator cuff tear or rupture of right shoulder, not specified as traumatic: Secondary | ICD-10-CM | POA: Diagnosis present

## 2019-06-28 DIAGNOSIS — Z96611 Presence of right artificial shoulder joint: Secondary | ICD-10-CM

## 2019-06-28 DIAGNOSIS — G4730 Sleep apnea, unspecified: Secondary | ICD-10-CM | POA: Diagnosis present

## 2019-06-28 DIAGNOSIS — F17210 Nicotine dependence, cigarettes, uncomplicated: Secondary | ICD-10-CM | POA: Diagnosis present

## 2019-06-28 DIAGNOSIS — K219 Gastro-esophageal reflux disease without esophagitis: Secondary | ICD-10-CM | POA: Diagnosis present

## 2019-06-28 DIAGNOSIS — F419 Anxiety disorder, unspecified: Secondary | ICD-10-CM | POA: Diagnosis present

## 2019-06-28 HISTORY — PX: REVERSE SHOULDER ARTHROPLASTY: SHX5054

## 2019-06-28 SURGERY — ARTHROPLASTY, SHOULDER, TOTAL, REVERSE
Anesthesia: General | Site: Shoulder | Laterality: Right

## 2019-06-28 MED ORDER — PROPOFOL 10 MG/ML IV BOLUS
INTRAVENOUS | Status: DC | PRN
  Administered 2019-06-28: 200 mg via INTRAVENOUS

## 2019-06-28 MED ORDER — PHENOL 1.4 % MT LIQD: 1.0000 | OROMUCOSAL | Status: DC | PRN

## 2019-06-28 MED ORDER — DIVALPROEX SODIUM ER 500 MG PO TB24
500.0000 mg | ORAL_TABLET | Freq: Every day | ORAL | Status: DC
  Administered 2019-06-28: 500 mg via ORAL
  Filled 2019-06-28: qty 1

## 2019-06-28 MED ORDER — FENTANYL CITRATE (PF) 100 MCG/2ML IJ SOLN
INTRAMUSCULAR | Status: AC
  Filled 2019-06-28: qty 2

## 2019-06-28 MED ORDER — HYDROMORPHONE HCL 1 MG/ML IJ SOLN: 0.5000 mg | INTRAMUSCULAR | Status: DC | PRN

## 2019-06-28 MED ORDER — METHOCARBAMOL 500 MG PO TABS
500.0000 mg | ORAL_TABLET | Freq: Four times a day (QID) | ORAL | Status: DC
  Administered 2019-06-28 (×2): 500 mg via ORAL
  Filled 2019-06-28 (×2): qty 1

## 2019-06-28 MED ORDER — SODIUM CHLORIDE 0.9 % IV SOLN
INTRAVENOUS | Status: DC | PRN
  Administered 2019-06-28: 15 ug/min via INTRAVENOUS

## 2019-06-28 MED ORDER — ONDANSETRON HCL 4 MG/2ML IJ SOLN
INTRAMUSCULAR | Status: AC
  Filled 2019-06-28: qty 2

## 2019-06-28 MED ORDER — LIDOCAINE 2% (20 MG/ML) 5 ML SYRINGE
INTRAMUSCULAR | Status: AC
  Filled 2019-06-28: qty 5

## 2019-06-28 MED ORDER — POLYETHYLENE GLYCOL 3350 17 G PO PACK: 17.0000 g | PACK | Freq: Every day | ORAL | Status: DC | PRN

## 2019-06-28 MED ORDER — OXYCODONE HCL 5 MG PO TABS
10.0000 mg | ORAL_TABLET | Freq: Three times a day (TID) | ORAL | Status: DC
  Administered 2019-06-28: 10 mg via ORAL
  Filled 2019-06-28: qty 2

## 2019-06-28 MED ORDER — DEXAMETHASONE SODIUM PHOSPHATE 10 MG/ML IJ SOLN
INTRAMUSCULAR | Status: AC
  Filled 2019-06-28: qty 1

## 2019-06-28 MED ORDER — GABAPENTIN 300 MG PO CAPS
300.0000 mg | ORAL_CAPSULE | Freq: Once | ORAL | Status: AC
  Administered 2019-06-28: 300 mg via ORAL
  Filled 2019-06-28: qty 1

## 2019-06-28 MED ORDER — FENTANYL CITRATE (PF) 100 MCG/2ML IJ SOLN
INTRAMUSCULAR | Status: DC | PRN
  Administered 2019-06-28: 100 ug via INTRAVENOUS

## 2019-06-28 MED ORDER — ERENUMAB-AOOE 140 MG/ML ~~LOC~~ SOAJ: 140.0000 mg | SUBCUTANEOUS | Status: DC

## 2019-06-28 MED ORDER — ACETAMINOPHEN 325 MG PO TABS: 325.0000 mg | ORAL_TABLET | Freq: Four times a day (QID) | ORAL | Status: DC | PRN

## 2019-06-28 MED ORDER — MENTHOL 3 MG MT LOZG: 1.0000 | LOZENGE | OROMUCOSAL | Status: DC | PRN

## 2019-06-28 MED ORDER — CEFAZOLIN SODIUM-DEXTROSE 2-4 GM/100ML-% IV SOLN
2.0000 g | Freq: Four times a day (QID) | INTRAVENOUS | Status: AC
  Administered 2019-06-28 – 2019-06-29 (×3): 2 g via INTRAVENOUS
  Filled 2019-06-28 (×4): qty 100

## 2019-06-28 MED ORDER — OXYCODONE HCL 5 MG/5ML PO SOLN: 5.0000 mg | Freq: Once | ORAL | Status: DC | PRN

## 2019-06-28 MED ORDER — ARIPIPRAZOLE 5 MG PO TABS
5.0000 mg | ORAL_TABLET | Freq: Every day | ORAL | Status: DC
  Administered 2019-06-28: 5 mg via ORAL
  Filled 2019-06-28 (×3): qty 1

## 2019-06-28 MED ORDER — SUCCINYLCHOLINE CHLORIDE 200 MG/10ML IV SOSY
PREFILLED_SYRINGE | INTRAVENOUS | Status: AC
  Filled 2019-06-28: qty 10

## 2019-06-28 MED ORDER — LINACLOTIDE 145 MCG PO CAPS
290.0000 ug | ORAL_CAPSULE | Freq: Every day | ORAL | Status: DC
  Administered 2019-06-29: 290 ug via ORAL
  Filled 2019-06-28: qty 2

## 2019-06-28 MED ORDER — BUPIVACAINE-EPINEPHRINE (PF) 0.25% -1:200000 IJ SOLN
INTRAMUSCULAR | Status: DC | PRN
  Administered 2019-06-28: 13 mL

## 2019-06-28 MED ORDER — BREXPIPRAZOLE 2 MG PO TABS
2.0000 mg | ORAL_TABLET | Freq: Every day | ORAL | Status: DC
  Administered 2019-06-28: 2 mg via ORAL
  Filled 2019-06-28 (×3): qty 1

## 2019-06-28 MED ORDER — OXYCODONE HCL 5 MG PO TABS
10.0000 mg | ORAL_TABLET | Freq: Once | ORAL | Status: AC
  Administered 2019-06-28: 10 mg via ORAL
  Filled 2019-06-28: qty 2

## 2019-06-28 MED ORDER — TOPIRAMATE 25 MG PO TABS
50.0000 mg | ORAL_TABLET | Freq: Every day | ORAL | Status: DC
  Administered 2019-06-28: 50 mg via ORAL
  Filled 2019-06-28: qty 2

## 2019-06-28 MED ORDER — CEFAZOLIN SODIUM-DEXTROSE 2-4 GM/100ML-% IV SOLN
2.0000 g | INTRAVENOUS | Status: AC
  Administered 2019-06-28: 2 g via INTRAVENOUS
  Filled 2019-06-28: qty 100

## 2019-06-28 MED ORDER — OXYCODONE HCL 5 MG PO TABS: 5.0000 mg | ORAL_TABLET | Freq: Once | ORAL | Status: DC | PRN

## 2019-06-28 MED ORDER — ONDANSETRON HCL 4 MG/2ML IJ SOLN: 4.0000 mg | Freq: Once | INTRAMUSCULAR | Status: DC | PRN

## 2019-06-28 MED ORDER — VARENICLINE TARTRATE 1 MG PO TABS
1.0000 mg | ORAL_TABLET | Freq: Two times a day (BID) | ORAL | Status: DC
  Administered 2019-06-28: 1 mg via ORAL
  Filled 2019-06-28 (×2): qty 1

## 2019-06-28 MED ORDER — VORTIOXETINE HBR 5 MG PO TABS
20.0000 mg | ORAL_TABLET | Freq: Every day | ORAL | Status: DC
  Administered 2019-06-28: 20 mg via ORAL
  Filled 2019-06-28 (×2): qty 4

## 2019-06-28 MED ORDER — SUMATRIPTAN SUCCINATE 50 MG PO TABS
50.0000 mg | ORAL_TABLET | ORAL | Status: DC | PRN
  Filled 2019-06-28: qty 1

## 2019-06-28 MED ORDER — BUPIVACAINE-EPINEPHRINE (PF) 0.25% -1:200000 IJ SOLN
INTRAMUSCULAR | Status: AC
  Filled 2019-06-28: qty 30

## 2019-06-28 MED ORDER — EPHEDRINE SULFATE-NACL 50-0.9 MG/10ML-% IV SOSY
PREFILLED_SYRINGE | INTRAVENOUS | Status: DC | PRN
  Administered 2019-06-28: 10 mg via INTRAVENOUS
  Administered 2019-06-28: 5 mg via INTRAVENOUS

## 2019-06-28 MED ORDER — PANTOPRAZOLE SODIUM 40 MG PO TBEC: 40.0000 mg | DELAYED_RELEASE_TABLET | Freq: Every day | ORAL | Status: DC | PRN

## 2019-06-28 MED ORDER — BUPIVACAINE HCL (PF) 0.5 % IJ SOLN
INTRAMUSCULAR | Status: DC | PRN
  Administered 2019-06-28: 15 mL via PERINEURAL

## 2019-06-28 MED ORDER — TOPIRAMATE 25 MG PO TABS: 25.0000 mg | ORAL_TABLET | Freq: Every day | ORAL | Status: DC

## 2019-06-28 MED ORDER — SUCCINYLCHOLINE CHLORIDE 200 MG/10ML IV SOSY
PREFILLED_SYRINGE | INTRAVENOUS | Status: DC | PRN
  Administered 2019-06-28: 140 mg via INTRAVENOUS

## 2019-06-28 MED ORDER — SODIUM CHLORIDE 0.9 % IV SOLN
INTRAVENOUS | Status: DC
  Administered 2019-06-28 (×2): via INTRAVENOUS

## 2019-06-28 MED ORDER — DICLOFENAC SODIUM 2 % TD SOLN: 2.0000 | Freq: Two times a day (BID) | TRANSDERMAL | Status: DC | PRN

## 2019-06-28 MED ORDER — METOCLOPRAMIDE HCL 5 MG/ML IJ SOLN: 5.0000 mg | Freq: Three times a day (TID) | INTRAMUSCULAR | Status: DC | PRN

## 2019-06-28 MED ORDER — ONDANSETRON HCL 4 MG/2ML IJ SOLN
INTRAMUSCULAR | Status: DC | PRN
  Administered 2019-06-28: 4 mg via INTRAVENOUS

## 2019-06-28 MED ORDER — DEXAMETHASONE SODIUM PHOSPHATE 10 MG/ML IJ SOLN
INTRAMUSCULAR | Status: DC | PRN
  Administered 2019-06-28: 8 mg via INTRAVENOUS

## 2019-06-28 MED ORDER — EPHEDRINE 5 MG/ML INJ
INTRAVENOUS | Status: AC
  Filled 2019-06-28: qty 10

## 2019-06-28 MED ORDER — BUPIVACAINE LIPOSOME 1.3 % IJ SUSP
INTRAMUSCULAR | Status: DC | PRN
  Administered 2019-06-28: 10 mL via PERINEURAL

## 2019-06-28 MED ORDER — BISACODYL 10 MG RE SUPP: 10.0000 mg | Freq: Every day | RECTAL | Status: DC | PRN

## 2019-06-28 MED ORDER — DOCUSATE SODIUM 100 MG PO CAPS
100.0000 mg | ORAL_CAPSULE | Freq: Two times a day (BID) | ORAL | Status: DC
  Administered 2019-06-28: 100 mg via ORAL
  Filled 2019-06-28: qty 1

## 2019-06-28 MED ORDER — METOCLOPRAMIDE HCL 5 MG PO TABS: 5.0000 mg | ORAL_TABLET | Freq: Three times a day (TID) | ORAL | Status: DC | PRN

## 2019-06-28 MED ORDER — OXYCODONE HCL ER 20 MG PO T12A
20.0000 mg | EXTENDED_RELEASE_TABLET | Freq: Two times a day (BID) | ORAL | Status: DC
  Administered 2019-06-28: 20 mg via ORAL
  Filled 2019-06-28: qty 1

## 2019-06-28 MED ORDER — LACTATED RINGERS IV SOLN
INTRAVENOUS | Status: DC
  Administered 2019-06-28: 10:00:00 via INTRAVENOUS

## 2019-06-28 MED ORDER — ONDANSETRON HCL 4 MG/2ML IJ SOLN: 4.0000 mg | Freq: Four times a day (QID) | INTRAMUSCULAR | Status: DC | PRN

## 2019-06-28 MED ORDER — ACETAMINOPHEN 10 MG/ML IV SOLN: 1000.0000 mg | Freq: Once | INTRAVENOUS | Status: DC | PRN

## 2019-06-28 MED ORDER — LIDOCAINE 2% (20 MG/ML) 5 ML SYRINGE
INTRAMUSCULAR | Status: DC | PRN
  Administered 2019-06-28: 40 mg via INTRAVENOUS

## 2019-06-28 MED ORDER — BUPROPION HCL ER (XL) 300 MG PO TB24: 300.0000 mg | ORAL_TABLET | Freq: Every day | ORAL | Status: DC

## 2019-06-28 MED ORDER — ONDANSETRON HCL 4 MG PO TABS: 4.0000 mg | ORAL_TABLET | Freq: Four times a day (QID) | ORAL | Status: DC | PRN

## 2019-06-28 MED ORDER — ACETAMINOPHEN 500 MG PO TABS
1000.0000 mg | ORAL_TABLET | Freq: Once | ORAL | Status: AC
  Administered 2019-06-28: 1000 mg via ORAL
  Filled 2019-06-28: qty 2

## 2019-06-28 MED ORDER — 0.9 % SODIUM CHLORIDE (POUR BTL) OPTIME
TOPICAL | Status: DC | PRN
  Administered 2019-06-28: 1000 mL

## 2019-06-28 MED ORDER — OXYCODONE HCL ER 20 MG PO T12A
20.0000 mg | EXTENDED_RELEASE_TABLET | Freq: Once | ORAL | Status: AC
  Administered 2019-06-28: 20 mg via ORAL
  Filled 2019-06-28: qty 1

## 2019-06-28 MED ORDER — DOXEPIN HCL 50 MG PO CAPS
50.0000 mg | ORAL_CAPSULE | Freq: Every day | ORAL | Status: DC
  Administered 2019-06-28: 50 mg via ORAL
  Filled 2019-06-28 (×2): qty 1

## 2019-06-28 MED ORDER — FENTANYL CITRATE (PF) 100 MCG/2ML IJ SOLN
50.0000 ug | INTRAMUSCULAR | Status: DC
  Administered 2019-06-28: 50 ug via INTRAVENOUS
  Filled 2019-06-28: qty 2

## 2019-06-28 MED ORDER — CHLORHEXIDINE GLUCONATE 4 % EX LIQD: 60.0000 mL | Freq: Once | CUTANEOUS | Status: DC

## 2019-06-28 MED ORDER — MIDAZOLAM HCL 2 MG/2ML IJ SOLN
1.0000 mg | INTRAMUSCULAR | Status: DC
  Administered 2019-06-28: 2 mg via INTRAVENOUS
  Filled 2019-06-28: qty 2

## 2019-06-28 MED ORDER — FENTANYL CITRATE (PF) 100 MCG/2ML IJ SOLN: 25.0000 ug | INTRAMUSCULAR | Status: DC | PRN

## 2019-06-28 SURGICAL SUPPLY — 68 items
BAG ZIPLOCK 12X15 (MISCELLANEOUS) ×2 IMPLANT
BIT DRILL 1.6MX128 (BIT) ×1 IMPLANT
BIT DRILL 170X2.5X (BIT) IMPLANT
BIT DRL 170X2.5X (BIT) ×1
BLADE SAG 18X100X1.27 (BLADE) ×2 IMPLANT
COVER BACK TABLE 60X90IN (DRAPES) ×2 IMPLANT
COVER SURGICAL LIGHT HANDLE (MISCELLANEOUS) ×2 IMPLANT
COVER WAND RF STERILE (DRAPES) IMPLANT
DECANTER SPIKE VIAL GLASS SM (MISCELLANEOUS) ×2 IMPLANT
DRAPE INCISE IOBAN 66X45 STRL (DRAPES) ×2 IMPLANT
DRAPE ORTHO SPLIT 77X108 STRL (DRAPES) ×2
DRAPE SHEET LG 3/4 BI-LAMINATE (DRAPES) ×2 IMPLANT
DRAPE SURG ORHT 6 SPLT 77X108 (DRAPES) ×2 IMPLANT
DRAPE U-SHAPE 47X51 STRL (DRAPES) ×2 IMPLANT
DRILL 2.5 (BIT) ×1
DRSG ADAPTIC 3X8 NADH LF (GAUZE/BANDAGES/DRESSINGS) ×2 IMPLANT
DRSG PAD ABDOMINAL 8X10 ST (GAUZE/BANDAGES/DRESSINGS) ×2 IMPLANT
DURAPREP 26ML APPLICATOR (WOUND CARE) ×2 IMPLANT
ECCENTRIC EPIPHYSI MODULAR SZ1 (Trauma) IMPLANT
ELECT BLADE TIP CTD 4 INCH (ELECTRODE) ×2 IMPLANT
ELECT NDL TIP 2.8 STRL (NEEDLE) ×1 IMPLANT
ELECT NEEDLE TIP 2.8 STRL (NEEDLE) ×2 IMPLANT
ELECT REM PT RETURN 15FT ADLT (MISCELLANEOUS) ×2 IMPLANT
GAUZE SPONGE 4X4 12PLY STRL (GAUZE/BANDAGES/DRESSINGS) ×2 IMPLANT
GLENOSPHERE DXTEND STD 38 (Orthopedic Implant) IMPLANT
GLENSOPHERE DXTEND STD 38 (Orthopedic Implant) ×2 IMPLANT
GLOVE BIOGEL PI ORTHO PRO 7.5 (GLOVE) ×1
GLOVE BIOGEL PI ORTHO PRO SZ8 (GLOVE) ×1
GLOVE ORTHO TXT STRL SZ7.5 (GLOVE) ×2 IMPLANT
GLOVE PI ORTHO PRO STRL 7.5 (GLOVE) ×1 IMPLANT
GLOVE PI ORTHO PRO STRL SZ8 (GLOVE) ×1 IMPLANT
GLOVE SURG ORTHO 8.5 STRL (GLOVE) ×2 IMPLANT
GOWN STRL REUS W/TWL XL LVL3 (GOWN DISPOSABLE) ×4 IMPLANT
KIT BASIN OR (CUSTOM PROCEDURE TRAY) ×2 IMPLANT
KIT TURNOVER KIT A (KITS) IMPLANT
MANIFOLD NEPTUNE II (INSTRUMENTS) ×2 IMPLANT
METAGLENE DELTA EXTEND (Trauma) IMPLANT
METAGLENE DXTEND (Trauma) ×2 IMPLANT
MODULAR ECCENTRIC EPIPHYSI SZ1 (Trauma) ×2 IMPLANT
NDL MAYO CATGUT SZ4 TPR NDL (NEEDLE) IMPLANT
NEEDLE MAYO CATGUT SZ4 (NEEDLE) ×2 IMPLANT
NS IRRIG 1000ML POUR BTL (IV SOLUTION) ×2 IMPLANT
PACK SHOULDER (CUSTOM PROCEDURE TRAY) ×2 IMPLANT
PIN GUIDE 1.2 (PIN) ×1 IMPLANT
PIN METAGLENE 2.5 (PIN) ×1 IMPLANT
PROTECTOR NERVE ULNAR (MISCELLANEOUS) ×2 IMPLANT
RESTRAINT HEAD UNIVERSAL NS (MISCELLANEOUS) ×2 IMPLANT
SCREW 4.5X24MM (Screw) ×1 IMPLANT
SCREW 4.5X36MM (Screw) ×1 IMPLANT
SCREW 48L (Screw) ×1 IMPLANT
SCREW BN 24X4.5XLCK STRL (Screw) IMPLANT
SLING ARM FOAM STRAP LRG (SOFTGOODS) ×1 IMPLANT
SPACER 38 PLUS 3 (Spacer) ×1 IMPLANT
STEM DELTA DIA 10 HA (Stem) ×1 IMPLANT
STRIP CLOSURE SKIN 1/2X4 (GAUZE/BANDAGES/DRESSINGS) ×2 IMPLANT
SUCTION FRAZIER HANDLE 10FR (MISCELLANEOUS) ×1
SUCTION TUBE FRAZIER 10FR DISP (MISCELLANEOUS) ×1 IMPLANT
SUT FIBERWIRE #2 38 T-5 BLUE (SUTURE) ×6
SUT MNCRL AB 4-0 PS2 18 (SUTURE) ×2 IMPLANT
SUT VIC AB 0 CT1 36 (SUTURE) ×4 IMPLANT
SUT VIC AB 0 CT2 27 (SUTURE) ×2 IMPLANT
SUT VIC AB 2-0 CT1 27 (SUTURE) ×1
SUT VIC AB 2-0 CT1 TAPERPNT 27 (SUTURE) ×1 IMPLANT
SUTURE FIBERWR #2 38 T-5 BLUE (SUTURE) ×1 IMPLANT
TAPE CLOTH SURG 6X10 WHT LF (GAUZE/BANDAGES/DRESSINGS) ×1 IMPLANT
TOWEL OR 17X26 10 PK STRL BLUE (TOWEL DISPOSABLE) ×2 IMPLANT
WATER STERILE IRR 1000ML POUR (IV SOLUTION) ×2 IMPLANT
YANKAUER SUCT BULB TIP 10FT TU (MISCELLANEOUS) ×2 IMPLANT

## 2019-06-28 NOTE — Anesthesia Procedure Notes (Signed)
Anesthesia Regional Block: Interscalene brachial plexus block   Pre-Anesthetic Checklist: ,, timeout performed, Correct Patient, Correct Site, Correct Laterality, Correct Procedure, Correct Position, site marked, Risks and benefits discussed,  Surgical consent,  Pre-op evaluation,  At surgeon's request and post-op pain management  Laterality: Right  Prep: chloraprep       Needles:  Injection technique: Single-shot  Needle Type: Echogenic Needle     Needle Length: 5cm  Needle Gauge: 21     Additional Needles:   Narrative:  Start time: 06/28/2019 11:38 AM End time: 06/28/2019 11:42 AM Injection made incrementally with aspirations every 5 mL.  Performed by: Personally  Anesthesiologist: Audry Pili, MD  Additional Notes: No pain on injection. No increased resistance to injection. Injection made in 5cc increments. Good needle visualization. Patient tolerated the procedure well.

## 2019-06-28 NOTE — Brief Op Note (Signed)
06/28/2019  2:48 PM  PATIENT:  Nicole Bartlett  53 y.o. female  PRE-OPERATIVE DIAGNOSIS:  Right shoulder rotator cuff tear arthropathy  POST-OPERATIVE DIAGNOSIS:  Right shoulder rotator cuff tear arthropathy  PROCEDURE:  Procedure(s) with comments: REVERSE SHOULDER ARTHROPLASTY (Right) - with interscalene block DePuy Delta Xtend with subscapularis repair  SURGEON:  Surgeon(s) and Role:    Netta Cedars, MD - Primary  PHYSICIAN ASSISTANT:   ASSISTANTS: Ventura Bruns, PA-C   ANESTHESIA:   regional and general  EBL:  50 mL   BLOOD ADMINISTERED:none  DRAINS: none   LOCAL MEDICATIONS USED:  MARCAINE     SPECIMEN:  No Specimen  DISPOSITION OF SPECIMEN:  N/A  COUNTS:  YES  TOURNIQUET:  * No tourniquets in log *  DICTATION: .Other Dictation: Dictation Number [id number]  PLAN OF CARE: Admit to inpatient   PATIENT DISPOSITION:  PACU - hemodynamically stable.   Delay start of Pharmacological VTE agent (>24hrs) due to surgical blood loss or risk of bleeding: not applicable

## 2019-06-28 NOTE — Op Note (Signed)
NAME: DEJANE, SCHEIBE MEDICAL RECORD [id number] ACCOUNT [id number] DATE OF BIRTH:22-Jan-1966 FACILITY: [facility] LOCATION: [facility] PHYSICIAN:STEVEN Orlena Sheldon, MD  OPERATIVE REPORT  DATE OF PROCEDURE:  06/28/2019  PREOPERATIVE DIAGNOSIS:  Right shoulder end-stage osteoarthritis.  POSTOPERATIVE DIAGNOSIS:  Right shoulder end-stage osteoarthritis.  PROCEDURE PERFORMED:  Right reverse total shoulder arthroplasty using DePuy Delta Xtend prosthesis with subscapularis repair.  ATTENDING SURGEON:  Esmond Plants, MD  ASSISTANT:  Darol Destine, [place]  ANESTHESIA:  General anesthesia was used plus interscalene block.  ESTIMATED BLOOD LOSS:  Less than 50 mL.  FLUID REPLACEMENT:  1500 mL crystalloid.  INSTRUMENT COUNTS:  Correct.  COMPLICATIONS:  There were no complications.  ANTIBIOTICS:  Perioperative antibiotics were given.  INDICATIONS:  The patient is a 53 year old female with worsening right shoulder pain and dysfunction secondary to rotator cuff tear arthropathy in the shoulder.  She has had progressive functional loss and increased pain despite conservative management.   The patient has an absence of supraspinatus and infraspinatus tendons with a history of prior rotator cuff repair.  Given the failure of conservative management, she elected to proceed with reverse shoulder replacement to provide fixed focal mechanics  to the shoulder and eliminate pain and restore some function.  Informed consent obtained.  DESCRIPTION OF PROCEDURE:  After an adequate level of anesthesia was achieved, the patient was positioned in the modified beach chair position.  Right shoulder correctly identified and sterilely prepped and draped in the usual manner.  Time-out called  verifying correct patient, correct site.  We entered the patient's shoulder using a standard deltopectoral approach, started the coracoid process, extending down to the anterior humerus.  Dissection carried out down through the  subcutaneous tissues with  Bovie electrocautery.  Identified the cephalic vein and took that laterally with the deltoid.  The pectoralis was taken medially.  We identified the conjoined tendon and retracted that medially.  We identified the biceps tendon, which was likely  tenodesed in prior surgery, but we whipstitched that into the pectoralis tendon insertion site just to further tenodese that.  We then released the subscapularis off the lesser tuberosity and placed #2 FiberWire sutures in a modified Mason-Allen suture  technique in the free edge of the tendon for repair of the tendon at the end of surgery.  We then released the inferior capsule, progressively externally rotating.  We delivered the humerus out of the wound.  We entered the proximal humerus with a 6 mm  reamer and reamed up to a size 10 mm.  We then went ahead and used the 10 mm intramedullary resection guide and an oscillating saw and resected the humeral head at 10 degrees of retroversion with the neck resection guide at the appropriate height.  Next,  we removed excess osteophytes with a rongeur, subluxed the humerus posteriorly, providing excellent exposure of the glenoid face.  We placed our retractors.  We then went ahead and removed the glenoid labrum and the capsule, exposing the glenoid itself.   We placed a guide pin centered low on the glenoid and then used the reamer to ream for the baseplate.  We did a peripheral hand reaming, drilled our central peg hole, and impacted the metaglene baseplate into position, which was centered nicely in the  inferior portion of the glenoid and slightly tilted inferiorly.  We then drilled our peripheral holes, getting a good 48 inferior screw, a 30 superior screw, and a 24 locked posterior screw.  All screws were locking with baseplate security and good  support.  We then took a 38 standard glenosphere and impacted that and screwed that into place on the baseplate.  We did a finger sweep to make  sure there was no soft tissue that was caught up in that bearing.  We irrigated thoroughly.  Next, we directed  our attention back toward the humeral side, delivering the humerus back out of the wound.  We prepared the metaphyseal portion of the humerus with the reamer for the size 1 right and reamed to the appropriate depth.  Next, we took the trial implants,  the 10 stem, 1 right, metaphysis set on the 0 setting and placed in 10 degrees of retroversion.  We impacted that into position, reduced the shoulder with a 38+3 poly, had nice stability and good tension on the conjoined tendon.  No gapping with inferior  pole and external rotation.  We then removed the trial components, irrigated thoroughly, drilled holes in lesser tuberosity, and placed #2 FiberWire suture for repair of the subscapularis.  We then used the HA-coated press-fit stem, 10 stem, and a 1  right metaphysis and used impaction grafting technique with available bone graft.  We had impacted that in position and 10 degrees of retroversion and had a very, very stable humeral seating.  At this point, selected the real 38+3 poly, impacted that  into position, reduced the shoulder.  We were happy with soft tissue balancing with no impingement.  We irrigated again and then repaired the subscapularis anatomically back to bone with sutures through drill holes, and we had a nice anatomic repair.  We  irrigated thoroughly.  We then went ahead and closed the deltopectoral interval with 0 Vicryl suture, followed by 2-0 Vicryl for subcutaneous closure, and 4-0 Monocryl for skin.  Steri-Strips applied followed by a sterile dressing.  The patient  tolerated surgery well.  LN/NUANCE  D:06/28/2019 T:06/28/2019 JOB:[id number]

## 2019-06-28 NOTE — Anesthesia Procedure Notes (Signed)
Procedure Name: Intubation Date/Time: 06/28/2019 1:08 PM Performed by: Victoriano Lain, CRNA Pre-anesthesia Checklist: Patient identified, Emergency Drugs available, Suction available, Patient being monitored and Timeout performed Patient Re-evaluated:Patient Re-evaluated prior to induction Oxygen Delivery Method: Circle system utilized Preoxygenation: Pre-oxygenation with 100% oxygen Induction Type: IV induction, Rapid sequence and Cricoid Pressure applied Laryngoscope Size: Glidescope and 3 Grade View: Grade I Tube type: Parker flex tip Tube size: 7.5 mm Number of attempts: 1 Airway Equipment and Method: Stylet and Video-laryngoscopy Placement Confirmation: ETT inserted through vocal cords under direct vision,  positive ETCO2 and breath sounds checked- equal and bilateral Secured at: 21 cm Tube secured with: Tape Dental Injury: Teeth and Oropharynx as per pre-operative assessment  Comments: Elective Glidescope intubation due to pt's prior history of cervical fusion.

## 2019-06-28 NOTE — Interval H&P Note (Signed)
History and Physical Interval Note:  06/28/2019 12:35 PM  Nicole Bartlett  has presented today for surgery, with the diagnosis of Right shoulder cuff arthropathy.  The various methods of treatment have been discussed with the patient and family. After consideration of risks, benefits and other options for treatment, the patient has consented to  Procedure(s) with comments: REVERSE SHOULDER ARTHROPLASTY (Right) - with interscalene block as a surgical intervention.  The patient's history has been reviewed, patient examined, no change in status, stable for surgery.  I have reviewed the patient's chart and labs.  Questions were answered to the patient's satisfaction.     Augustin Schooling

## 2019-06-28 NOTE — Discharge Instructions (Signed)
Ice to the shoulder constantly.  Keep the incision covered and clean and dry for one week, then ok to get it wet in the shower. ° °Do exercise as instructed several times per day. ° °DO NOT reach behind your back or push up out of a chair with the operative arm. ° °Use a sling while you are up and around for comfort, may remove while seated.  Keep pillow propped behind the operative elbow. ° °Follow up with Dr Rhyker Silversmith in two weeks in the office, call [phone] for appt °

## 2019-06-28 NOTE — Progress Notes (Signed)
Assisted Dr. Brock with right, ultrasound guided, interscalene  block. Side rails up, monitors on throughout procedure. See vital signs in flow sheet. Tolerated Procedure well.  

## 2019-06-28 NOTE — Anesthesia Postprocedure Evaluation (Signed)
Anesthesia Post Note  Patient: Nicole Bartlett  Procedure(s) Performed: REVERSE SHOULDER ARTHROPLASTY (Right Shoulder)     Patient location during evaluation: PACU Anesthesia Type: General Level of consciousness: awake and alert Pain management: pain level controlled Vital Signs Assessment: post-procedure vital signs reviewed and stable Respiratory status: spontaneous breathing, nonlabored ventilation, respiratory function stable and patient connected to nasal cannula oxygen Cardiovascular status: blood pressure returned to baseline and stable Postop Assessment: no apparent nausea or vomiting Anesthetic complications: no    Last Vitals:  Vitals:   06/28/19 1530 06/28/19 1545  BP: 103/76 113/68  Pulse: 90 90  Resp: 10 11  Temp:  36.7 C  SpO2: 98% 98%    Last Pain:  Vitals:   06/28/19 1545  TempSrc:   PainSc: 0-No pain                 Audry Pili

## 2019-06-28 NOTE — Transfer of Care (Signed)
Immediate Anesthesia Transfer of Care Note  Patient: Nicole Bartlett  Procedure(s) Performed: REVERSE SHOULDER ARTHROPLASTY (Right Shoulder)  Patient Location: PACU  Anesthesia Type:General  Level of Consciousness: awake, alert , oriented and patient cooperative  Airway & Oxygen Therapy: Patient Spontanous Breathing and Patient connected to face mask oxygen  Post-op Assessment: Report given to RN, Post -op Vital signs reviewed and stable and Patient moving all extremities  Post vital signs: Reviewed and stable  Last Vitals:  Vitals Value Taken Time  BP    Temp    Pulse 99 06/28/19 1458  Resp 14 06/28/19 1458  SpO2 99 % 06/28/19 1458  Vitals shown include unvalidated device data.  Last Pain:  Vitals:   06/28/19 1222  TempSrc:   PainSc: 0-No pain      Patients Stated Pain Goal: 3 (27/07/86 7544)  Complications: No apparent anesthesia complications

## 2019-06-29 LAB — BASIC METABOLIC PANEL
Anion gap: 9 (ref 5–15)
BUN: 10 mg/dL (ref 6–20)
CO2: 22 mmol/L (ref 22–32)
Calcium: 8.2 mg/dL — ABNORMAL LOW (ref 8.9–10.3)
Chloride: 104 mmol/L (ref 98–111)
Creatinine, Ser: 0.66 mg/dL (ref 0.44–1.00)
GFR calc Af Amer: >60 mL/min (ref >60)
GFR calc non Af Amer: >60 mL/min (ref >60)
Glucose, Bld: 153 mg/dL — ABNORMAL HIGH (ref 70–99)
Potassium: 4.2 mmol/L (ref 3.5–5.1)
Sodium: 135 mmol/L (ref 135–145)

## 2019-06-29 LAB — HEMOGLOBIN AND HEMATOCRIT, BLOOD
HCT: 34 % — ABNORMAL LOW (ref 36.0–46.0)
Hemoglobin: 10.4 g/dL — ABNORMAL LOW (ref 12.0–15.0)

## 2019-06-29 NOTE — Progress Notes (Signed)
Orthopedics Progress Note  Subjective: Right shoulder still numb and heavy, no pain due to block.   Objective:  Vitals:   06/29/19 0057 06/29/19 0440  BP: 105/64 125/67  Pulse: 92 77  Resp: 14 14  Temp: 98.3 F (36.8 C) 97.8 F (36.6 C)  SpO2: 92% 96%    General: Awake and alert  Musculoskeletal: Right shoulder wound CDI, no swelling, dressing changed Neurovascularly intact  Lab Results  Component Value Date   WBC 5.7 06/26/2019   HGB 10.4 (L) 06/29/2019   HCT 34.0 (L) 06/29/2019   MCV 95.7 06/26/2019   PLT 233 06/26/2019       Component Value Date/Time   NA 135 06/29/2019 0305   K 4.2 06/29/2019 0305   CL 104 06/29/2019 0305   CO2 22 06/29/2019 0305   GLUCOSE 153 (H) 06/29/2019 0305   BUN 10 06/29/2019 0305   CREATININE 0.66 06/29/2019 0305   CALCIUM 8.2 (L) 06/29/2019 0305   GFRNONAA >60 06/29/2019 0305   GFRAA >60 06/29/2019 0305    No results found for: INR, PROTIME  Assessment/Plan: POD #1 s/p Procedure(s): REVERSE SHOULDER ARTHROPLASTY Occupational therapy this AM and then D/C home  Remo Lipps R. Veverly Fells, MD 06/29/2019 8:20 AM

## 2019-06-29 NOTE — Discharge Summary (Signed)
Orthopedic Discharge Summary        Physician Discharge Summary  Patient ID: Nicole Bartlett MRN: [id number] DOB/AGE: 1966-11-17 53 y.o.  Admit date: 06/28/2019 Discharge date: 06/29/2019   Procedures:  Procedure(s) (LRB): REVERSE SHOULDER ARTHROPLASTY (Right)  Attending Physician:  Dr. Esmond Plants  Admission Diagnoses:   Right shoulder OA and RC insufficiency  Discharge Diagnoses:  same   Past Medical History:  Diagnosis Date  . Anxiety and depression   . Chronic neck pain   . Fibromyalgia   . GERD (gastroesophageal reflux disease)   . Headache    migraines  . Narcotic dependence ([place])   . Sleep apnea   . Tremor     PCP: Jamesetta Geralds, MD   Discharged Condition: good  Hospital Course:  Patient underwent the above stated procedure on 06/28/2019. Patient tolerated the procedure well and brought to the recovery room in good condition and subsequently to the floor. Patient had an uncomplicated hospital course and was stable for discharge.   Disposition: Discharge disposition: 01-Home or Self Care      with follow up in 2 weeks   Follow-up Information    Netta Cedars, MD. Call in 2 weeks.   Specialty: Orthopedic Surgery Why: [phone] Contact information: [address] [place] [address] [phone]           Discharge Instructions    Call MD / Call 911   Complete by: As directed    If you experience chest pain or shortness of breath, CALL 911 and be transported to the hospital emergency room.  If you develope a fever above 101 F, pus (white drainage) or increased drainage or redness at the wound, or calf pain, call your surgeon's office.   Constipation Prevention   Complete by: As directed    Drink plenty of fluids.  Prune juice may be helpful.  You may use a stool softener, such as Colace (over the counter) 100 mg twice a day.  Use MiraLax (over the counter) for constipation as needed.   Diet - low sodium heart  healthy   Complete by: As directed    Driving restrictions   Complete by: As directed    No driving for 2 weeks   Increase activity slowly as tolerated   Complete by: As directed       Allergies as of 06/29/2019      Reactions   Sulfa Antibiotics Itching, Rash   angioedema   Bupropion Hives   Something in the compound drug.  (Zyban)      Medication List    TAKE these medications   ADZENYS XR-ODT PO Take 2 tablets by mouth every morning.   Aimovig 140 MG/ML Soaj Generic drug: Erenumab-aooe Inject 140 mg into the skin every 28 (twenty-eight) days.   ARIPiprazole 5 MG tablet Commonly known as: ABILIFY Take 5 mg by mouth daily.   buPROPion 300 MG 24 hr tablet Commonly known as: WELLBUTRIN XL Take 300 mg by mouth daily.   Chantix Continuing Month Pak 1 MG tablet Generic drug: varenicline Take 1 mg by mouth 2 (two) times daily.   divalproex 500 MG 24 hr tablet Commonly known as: DEPAKOTE ER Take 500 mg by mouth daily.   doxepin 50 MG capsule Commonly known as: SINEQUAN Take 50 mg by mouth at bedtime.   linaclotide 290 MCG Caps capsule Commonly known as: LINZESS Take 290 mcg by mouth daily before breakfast.   methocarbamol 500 MG tablet Commonly known as:  ROBAXIN Take 500 mg by mouth 4 (four) times daily.   OxyCONTIN 20 mg 12 hr tablet Generic drug: oxyCODONE Take 20 mg by mouth every 12 (twelve) hours.   Oxycodone HCl 10 MG Tabs Take 10 mg by mouth 3 (three) times daily.   pantoprazole 40 MG tablet Commonly known as: PROTONIX Take 40 mg by mouth daily as needed (heartburn).   Pennsaid 2 % Soln Generic drug: Diclofenac Sodium Apply 2 Pump topically 2 (two) times daily as needed (pain).   Rexulti 2 MG Tabs Generic drug: Brexpiprazole Take 2 mg by mouth daily.   rizatriptan 10 MG tablet Commonly known as: MAXALT May repeat in 2 hours if needed What changed:   how much to take  how to take this  when to take this  reasons to take this    topiramate 25 MG tablet Commonly known as: TOPAMAX Take 1 tab po am, 2 tabs po pm. What changed:   how much to take  how to take this  when to take this  additional instructions   Trintellix 20 MG Tabs tablet Generic drug: vortioxetine HBr Take 20 mg by mouth daily.         Signed: Augustin Schooling 06/29/2019, 8:21 AM  [facility] is now [facility] [address], [place], [place], [place] [address] Phone: [place]

## 2019-06-29 NOTE — Evaluation (Signed)
Occupational Therapy Evaluation Patient Details Name: Nicole Bartlett MRN: [id number] DOB: January 07, 1966 Today's Date: 06/29/2019    History of Present Illness 53 yo female s/p R reverse TSA with subscapularis repair PMH anxiety, chronic neck pain fibromyalgia narcotic dependence tremor, C5-6 fusion 2002 and 2010, Bil rotator cuff repair   Clinical Impression   Patient evaluated by Occupational Therapy with no further acute OT needs identified. All education has been completed and the patient has no further questions. See below for any follow-up Occupational Therapy or equipment needs. OT to sign off. Thank you for referral.      Follow Up Recommendations  Follow surgeon's recommendation for DC plan and follow-up therapies    Equipment Recommendations  None recommended by OT    Recommendations for Other Services       Precautions / Restrictions Precautions Precautions: Shoulder Type of Shoulder Precautions: conservative- hand wrist elbow AROM only Shoulder Interventions: Shoulder sling/immobilizer;At all times;Off for dressing/bathing/exercises Precaution Comments: shoulder protcol handout provided and reviewed for adls Required Braces or Orthoses: Sling Restrictions Weight Bearing Restrictions: Yes RUE Weight Bearing: Non weight bearing      Mobility Bed Mobility Overal bed mobility: Modified Independent                Transfers Overall transfer level: Modified independent                    Balance                                           ADL either performed or assessed with clinical judgement   ADL Overall ADL's : Modified independent         Shoulder precautions ( handout provided): Educated patient on don doff brace with return demonstration,washing under arms with cloth, never to wash directly on incision site, avoid shoulder movement. positioning with pillows in chair for , sleeping positioning (recommend recliner if possible),  pt educated on dressing care during bathing/ dressing.  Home exercise program as stated below (indicated by MD).                                General ADL Comments: demonstrates dressing iwith use of L UE     Vision Baseline Vision/History: Wears glasses Wears Glasses: At all times       Perception     Praxis      Pertinent Vitals/Pain Pain Assessment: Faces Faces Pain Scale: Hurts a little bit Pain Location: block is still in place but patient reports "they are suppose to give me what i normally take at home so she needs to bring it soon" Pain Descriptors / Indicators: Operative site guarding Pain Intervention(s): Monitored during session;Repositioned;Ice applied     Hand Dominance Right   Extremity/Trunk Assessment Upper Extremity Assessment Upper Extremity Assessment: RUE deficits/detail RUE Deficits / Details: educated on proper sling positioning   Lower Extremity Assessment Lower Extremity Assessment: Overall WFL for tasks assessed   Cervical / Trunk Assessment Cervical / Trunk Assessment: Normal   Communication Communication Communication: No difficulties   Cognition Arousal/Alertness: Awake/alert Behavior During Therapy: WFL for tasks assessed/performed Overall Cognitive Status: Within Functional Limits for tasks assessed  General Comments  dressing dry and intact at this time    Exercises Exercises: Shoulder Shoulder Exercises Elbow Flexion: AROM;Right;5 reps Wrist Flexion: AROM;5 reps;Seated   Shoulder Instructions Shoulder Instructions Donning/doffing shirt without moving shoulder: Independent Method for sponge bathing under operated UE: Independent Donning/doffing sling/immobilizer: Independent Correct positioning of sling/immobilizer: Independent ROM for elbow, wrist and digits of operated UE: Independent Sling wearing schedule (on at all times/off for ADL's): Independent Proper  positioning of operated UE when showering: Independent Positioning of UE while sleeping: [place] expects to be discharged to:: Private residence Living Arrangements: Children;Parent;Other relatives Available Help at Discharge: Family Type of Home: House                 Bathroom Toilet: Standard                Prior Functioning/Environment Level of Independence: Independent                 OT Problem List:        OT Treatment/Interventions:      OT Goals(Current goals can be found in the care plan section) Acute Rehab OT Goals Patient Stated Goal: to dc home with daughter today Potential to Achieve Goals: Good  OT Frequency:     Barriers to D/C:            Co-evaluation              AM-PAC OT "6 Clicks" Daily Activity     Outcome Measure Help from another person eating meals?: None Help from another person taking care of personal grooming?: None Help from another person toileting, which includes using toliet, bedpan, or urinal?: None Help from another person bathing (including washing, rinsing, drying)?: None Help from another person to put on and taking off regular upper body clothing?: None Help from another person to put on and taking off regular lower body clothing?: None 6 Click Score: 24   End of Session Nurse Communication: Mobility status;Precautions  Activity Tolerance: Patient tolerated treatment well Patient left: in chair;with call bell/phone within reach  OT Visit Diagnosis: Unsteadiness on feet (R26.81)                Time: 8916-9450 OT Time Calculation (min): 19 min Charges:  OT General Charges $OT Visit: 1 Visit OT Evaluation $OT Eval Moderate Complexity: 1 Mod   Jeri Modena, OTR/L  Acute Rehabilitation Services Pager: [phone] Office: [phone] .   Jeri Modena 06/29/2019, 10:19 AM

## 2019-06-29 NOTE — Progress Notes (Signed)
The pt was provided with d/c instructions. After discussing the pt's plan of care, the pt reported no further questions or concerns.

## 2019-07-01 ENCOUNTER — Encounter (HOSPITAL_COMMUNITY): Payer: Self-pay | Admitting: Orthopedic Surgery

## 2019-07-10 DIAGNOSIS — F329 Major depressive disorder, single episode, unspecified: Secondary | ICD-10-CM | POA: Insufficient documentation

## 2019-07-10 DIAGNOSIS — F32A Depression, unspecified: Secondary | ICD-10-CM | POA: Insufficient documentation

## 2019-07-16 ENCOUNTER — Ambulatory Visit: Payer: BLUE CROSS/BLUE SHIELD | Admitting: Neurology

## 2019-07-25 DIAGNOSIS — M25562 Pain in left knee: Secondary | ICD-10-CM | POA: Insufficient documentation

## 2019-08-12 NOTE — Progress Notes (Signed)
PATIENT: Nicole Bartlett DOB: [birth year]  REASON FOR VISIT: follow up HISTORY FROM: patient  HISTORY OF PRESENT ILLNESS: Today 08/13/19  Nicole Bartlett is a 53 year old female with history of headaches and obstructive sleep apnea.  She does not use her BiPAP.  She remains on Topamax 25 mg in the morning, 50 mg in the evening. Higher doses have caused her to have difficulty focusing. She is also taking Depakote, prescribed by her psychiatrist.  She takes Maxalt as needed for headache. She had right shoulder replacement surgery in August 2020.  She is also taking Aimovig prescribed by her psychiatrist for migraines.  She reports her headaches are doing well.  She received 3-4 Aimovig injections, but missed the injection last month.  She has remained on Topamax.  She says with Aimovig she has had an 80% reduction in headaches.  She has not had to take Maxalt in 2 months.  She reports her headaches are triggered by sun sensitivity, driving at night when the sun is going down.  She indicates her health has been good.  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  Her psychiatrist is at the [facility]. She does have tremor to bilateral hands and head titubation for several years. She reports family history of same.  She has a follow-up unaccompanied.  HISTORY UPDATE 2/25/2020CM Nicole Bartlett, 53 year old female returns for follow-up with a long history of headaches for more than 26 years.  She also has obstructive sleep apnea but does not use her BiPAP.  Her headaches are generally due to weather changes and stress.  She is in the process of trying to sell her home and building a home at the same time.  She had recent changes to her medications by psychiatry.  She is now on Depakote ER 500 daily.  She was made aware that this is also a migraine preventive.  She remains on Topamax 25 1 in the morning and 2 at night.  She has not been able to tolerate  higher doses due to side effects.  Her current headaches are managed with Maxalt acutely.  She also has a history of chronic pain and is managed by [facility] with narcotics.  She has major depression and is seen by the [facility].  She returns for reevaluation  REVIEW OF SYSTEMS: Out of a complete 14 system review of symptoms, the patient complains only of the following symptoms, and all other reviewed systems are negative.  Dizziness, headache, numbness  ALLERGIES: Allergies  Allergen Reactions  . Sulfa Antibiotics Itching and Rash    angioedema   . Bupropion Hives    Something in the compound drug.  (Zyban)    HOME MEDICATIONS: Outpatient Medications Prior to Visit  Medication Sig Dispense Refill  . Amphetamine (ADZENYS XR-ODT PO) Take 2 tablets by mouth every morning.    Marland Kitchen buPROPion (WELLBUTRIN XL) 300 MG 24 hr tablet Take 300 mg by mouth daily.    Hendricks Limes CONTINUING MONTH PAK 1 MG tablet Take 1 mg by mouth 2 (two) times daily.     . divalproex (DEPAKOTE ER) 500 MG 24 hr tablet Take 500 mg by mouth daily.     Marland Kitchen doxepin (SINEQUAN) 50 MG capsule Take 50 mg by mouth at bedtime.    Marland Kitchen linaclotide (LINZESS) 290 MCG CAPS capsule Take 290 mcg by mouth daily before breakfast.     . methocarbamol (ROBAXIN) 500 MG  tablet Take 500 mg by mouth 4 (four) times daily.     Marland Kitchen oxyCODONE (OXYCONTIN) 20 mg 12 hr tablet Take 20 mg by mouth every 12 (twelve) hours.     . Oxycodone HCl 10 MG TABS Take 10 mg by mouth 3 (three) times daily.     . pantoprazole (PROTONIX) 40 MG tablet Take 40 mg by mouth daily as needed (heartburn).   5  . PENNSAID 2 % SOLN Apply 2 Pump topically 2 (two) times daily as needed (pain).   0  . REXULTI 2 MG TABS Take 2 mg by mouth daily.     . rizatriptan (MAXALT) 10 MG tablet May repeat in 2 hours if needed (Patient taking differently: Take 10 mg by mouth as needed for migraine. May repeat in 2 hours if needed) 30 tablet 1  . topiramate (TOPAMAX) 25 MG tablet Take 1  tab po am, 2 tabs po pm. (Patient taking differently: Take 25-50 mg by mouth See admin instructions. Take 25 mg by mouth in the morning and 50 mg at night. (3x a day)) 270 tablet 2  . TRINTELLIX 20 MG TABS tablet Take 20 mg by mouth daily.     Eduard Roux (AIMOVIG) 140 MG/ML SOAJ Inject 140 mg into the skin every 28 (twenty-eight) days.    . ARIPiprazole (ABILIFY) 5 MG tablet Take 5 mg by mouth daily.     No facility-administered medications prior to visit.     PAST MEDICAL HISTORY: Past Medical History:  Diagnosis Date  . Anxiety and depression   . Chronic neck pain   . Fibromyalgia   . GERD (gastroesophageal reflux disease)   . Headache    migraines  . Narcotic dependence ([place])   . Sleep apnea   . Tremor     PAST SURGICAL HISTORY: Past Surgical History:  Procedure Laterality Date  . C5/C6 fusion  2010  . C5/C7 fusion  2002  . CESAREAN SECTION     times 2  . GALLBLADDER SURGERY    . REVERSE SHOULDER ARTHROPLASTY Right 06/28/2019   Procedure: REVERSE SHOULDER ARTHROPLASTY;  Surgeon: Netta Cedars, MD;  Location: [facility];  Service: Orthopedics;  Laterality: Right;  with interscalene block  . ROTATOR CUFF REPAIR Bilateral     FAMILY HISTORY: Family History  Problem Relation Age of Onset  . Cancer Mother   . Dementia Mother   . Diabetes Father   . Migraines Sister   . Fibroids Daughter     SOCIAL HISTORY: Social History   Socioeconomic History  . Marital status: Legally Separated    Spouse name: Not on file  . Number of children: 2  . Years of education: Not on file  . Highest education level: Not on file  Occupational History  . Occupation: Manufacturing engineer: [employer]  . Financial resource strain: Not on file  . Food insecurity    Worry: Not on file    Inability: Not on file  . Transportation needs    Medical: Not on file    Non-medical: Not on file  Tobacco Use  . Smoking status: Current Every Day Smoker    Packs/day: 0.25     Years: 15.00    Pack years: 3.75    Types: Cigarettes  . Smokeless tobacco: Never Used  Substance and Sexual Activity  . Alcohol use: Yes    Alcohol/week: 0.0 standard drinks    Comment: rare  . Drug use: No  . Sexual  activity: Not on file  Lifestyle  . Physical activity    Days per week: Not on file    Minutes per session: Not on file  . Stress: Not on file  Relationships  . Social Herbalist on phone: Not on file    Gets together: Not on file    Attends religious service: Not on file    Active member of club or organization: Not on file    Attends meetings of clubs or organizations: Not on file    Relationship status: Not on file  . Intimate partner violence    Fear of current or ex partner: Not on file    Emotionally abused: Not on file    Physically abused: Not on file    Forced sexual activity: Not on file  Other Topics Concern  . Not on file  Social History Narrative   Patient is separated.   Patient has 2 children   Patient lives at home with daughter.   Patient drinks caffeine everyday until 5pm.          PHYSICAL EXAM  Vitals:   08/13/19 0800  BP: 118/78  Pulse: 90  Temp: (!) 96 F (35.6 C)  Weight: 217 lb 6.4 oz (98.6 kg)  Height: 5' 4.5" (1.638 m)   Body mass index is 36.74 kg/m.  Generalized: Well developed, in no acute distress   Neurological examination  Mentation: Alert oriented to time, place, history taking. Follows all commands speech and language fluent Cranial nerve II-XII: Pupils were equal round reactive to light. Extraocular movements were full, visual field were full on confrontational test. Facial sensation and strength were normal.  Head turning and shoulder shrug  were normal and symmetric. Motor: The motor testing reveals 5 over 5 strength of all 4 extremities. Good symmetric motor tone is noted throughout.  Sensory: Sensory testing is intact to soft touch on all 4 extremities. No evidence of extinction is noted.   Coordination: Cerebellar testing reveals good finger-nose-finger and heel-to-shin bilaterally.  Mild intention tremor right more than left with finger-nose-finger. Gait and station: Gait is normal. Tandem gait is normal. Romberg is negative. No drift is seen.  Reflexes: Deep tendon reflexes are symmetric and normal bilaterally.   DIAGNOSTIC DATA (LABS, IMAGING, TESTING) - I reviewed patient records, labs, notes, testing and imaging myself where available.  Lab Results  Component Value Date   WBC 5.7 06/26/2019   HGB 10.4 (L) 06/29/2019   HCT 34.0 (L) 06/29/2019   MCV 95.7 06/26/2019   PLT 233 06/26/2019      Component Value Date/Time   NA 135 06/29/2019 0305   K 4.2 06/29/2019 0305   CL 104 06/29/2019 0305   CO2 22 06/29/2019 0305   GLUCOSE 153 (H) 06/29/2019 0305   BUN 10 06/29/2019 0305   CREATININE 0.66 06/29/2019 0305   CALCIUM 8.2 (L) 06/29/2019 0305   GFRNONAA >60 06/29/2019 0305   GFRAA >60 06/29/2019 0305   No results found for: CHOL, HDL, LDLCALC, LDLDIRECT, TRIG, CHOLHDL No results found for: HGBA1C No results found for: VITAMINB12 No results found for: TSH   ASSESSMENT AND PLAN 53 y.o. year old female  has a past medical history of Anxiety and depression, Chronic neck pain, Fibromyalgia, GERD (gastroesophageal reflux disease), Headache, Narcotic dependence ([place]), Sleep apnea, and Tremor. here with:  1.  Chronic migraine headache -Headaches are under good control, no migraine in the last 2 months, may have occasional mild headache, relieved with tylenol  -  Continue Topamax 25 mg in the morning, 50 mg in the evening -Continue Aimovig 140 mg monthly injection (started by psychiatrist, received 3-4 injections, 80% improvement in headaches, has not had to take Maxalt in 2 months, has asked our office to take over prescription) -Continue Maxalt as needed -She also remains on Depakote, prescribed by psychiatrist -At next visit if headaches are still under good control,  can decrease Topamax dose -I encouraged annual follow-up with PCP  -Follow-up in 6 months or sooner if needed  2.  Obstructive sleep apnea -Diagnosed with this in the past, was seeing pulmonologist, has not been using BiPAP -We discussed, would consider referral to sleep doctor at our office for reevaluation, does not wish to proceed at this time  I spent 15 minutes with the patient. 50% of this time was spent discussing her plan of care.   Butler Denmark, AGNP-C, DNP 08/13/2019, 8:35 AM [facility] [address] [place], [place] [address] [phone]

## 2019-08-13 ENCOUNTER — Encounter: Payer: Self-pay | Admitting: Neurology

## 2019-08-13 ENCOUNTER — Ambulatory Visit (INDEPENDENT_AMBULATORY_CARE_PROVIDER_SITE_OTHER): Payer: BC Managed Care – PPO | Admitting: Neurology

## 2019-08-13 ENCOUNTER — Other Ambulatory Visit: Payer: Self-pay

## 2019-08-13 VITALS — BP 118/78 | HR 90 | Temp 96.0°F | Ht 64.5 in | Wt 217.4 lb

## 2019-08-13 DIAGNOSIS — G43909 Migraine, unspecified, not intractable, without status migrainosus: Secondary | ICD-10-CM | POA: Diagnosis not present

## 2019-08-13 MED ORDER — AIMOVIG 140 MG/ML ~~LOC~~ SOAJ: 140.0000 mg | SUBCUTANEOUS | 6 refills | Status: DC

## 2019-08-13 NOTE — Patient Instructions (Addendum)
1. I will fill your Aimovig 140 mg injection  2. Continue Topamax 3. Consider referral to sleep doctor in the future 4. Follow-up with primary care

## 2019-08-13 NOTE — Progress Notes (Signed)
I have read the note, and I agree with the clinical assessment and plan.  Charles K Willis   

## 2019-08-14 ENCOUNTER — Ambulatory Visit: Payer: BC Managed Care – PPO

## 2019-08-14 ENCOUNTER — Other Ambulatory Visit: Payer: Self-pay

## 2019-08-14 DIAGNOSIS — Z23 Encounter for immunization: Secondary | ICD-10-CM

## 2020-01-28 ENCOUNTER — Other Ambulatory Visit: Payer: Self-pay | Admitting: *Deleted

## 2020-01-28 MED ORDER — RIZATRIPTAN BENZOATE 10 MG PO TABS: ORAL_TABLET | ORAL | 1 refills | Status: DC

## 2020-02-11 ENCOUNTER — Ambulatory Visit: Payer: BC Managed Care – PPO | Admitting: Neurology

## 2020-03-02 ENCOUNTER — Other Ambulatory Visit: Payer: Self-pay | Admitting: *Deleted

## 2020-03-02 MED ORDER — TOPIRAMATE 25 MG PO TABS: ORAL_TABLET | ORAL | 2 refills | Status: DC

## 2020-03-26 ENCOUNTER — Ambulatory Visit: Payer: BC Managed Care – PPO | Admitting: Neurology

## 2020-04-16 ENCOUNTER — Ambulatory Visit: Payer: BC Managed Care – PPO | Admitting: Neurology

## 2020-04-16 ENCOUNTER — Other Ambulatory Visit: Payer: Self-pay

## 2020-04-16 ENCOUNTER — Encounter: Payer: Self-pay | Admitting: Neurology

## 2020-04-16 VITALS — BP 135/83 | HR 91 | Ht 64.0 in | Wt 225.0 lb

## 2020-04-16 DIAGNOSIS — G43909 Migraine, unspecified, not intractable, without status migrainosus: Secondary | ICD-10-CM

## 2020-04-16 MED ORDER — RIZATRIPTAN BENZOATE 10 MG PO TABS: ORAL_TABLET | ORAL | 1 refills | Status: DC

## 2020-04-16 MED ORDER — AIMOVIG 140 MG/ML ~~LOC~~ SOAJ: 140.0000 mg | SUBCUTANEOUS | 11 refills | Status: DC

## 2020-04-16 NOTE — Progress Notes (Signed)
PATIENT: Nicole Bartlett DOB: 10-Sep-1966  REASON FOR VISIT: follow up HISTORY FROM: patient  HISTORY OF PRESENT ILLNESS: Today 04/16/20  Nicole Bartlett is a 54 year old female with history of headaches and OSA, does not use BiPAP.  She remains on Aimovig 140 mg monthly injection, she has come off Topamax.  Headaches remain well controlled.  She may have 2 migraines a month, does have fairly frequent " regular headache" easily taken care of with Tylenol.  Sees her psychiatrist monthly, is on Depakote.  Migraines respond well to Maxalt, usually takes twice a month with good benefit.  Denies side effects of medications.  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  She works as an Optometrist at [employer]. Having some right Achilles tendon issues.  Presents today for evaluation unaccompanied.  HISTORY  08/13/2019 SS: Nicole Bartlett is a 54 year old female with history of headaches and obstructive sleep apnea.  She does not use her BiPAP.  She remains on Topamax 25 mg in the morning, 50 mg in the evening. Higher doses have caused her to have difficulty focusing. She is also taking Depakote, prescribed by her psychiatrist.  She takes Maxalt as needed for headache. She had right shoulder replacement surgery in August 2020.  She is also taking Aimovig prescribed by her psychiatrist for migraines.  She reports her headaches are doing well.  She received 3-4 Aimovig injections, but missed the injection last month.  She has remained on Topamax.  She says with Aimovig she has had an 80% reduction in headaches.  She has not had to take Maxalt in 2 months.  She reports her headaches are triggered by sun sensitivity, driving at night when the sun is going down.  She indicates her health has been good.  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  Her psychiatrist is at the [facility]. She does have tremor to bilateral hands and  head titubation for several years. She reports family history of same.  She has a follow-up unaccompanied.  REVIEW OF SYSTEMS: Out of a complete 14 system review of symptoms, the patient complains only of the following symptoms, and all other reviewed systems are negative.  headache  ALLERGIES: Allergies  Allergen Reactions  . Sulfa Antibiotics Itching and Rash    angioedema   . Bupropion Hives    Something in the compound drug.  (Zyban)    HOME MEDICATIONS: Outpatient Medications Prior to Visit  Medication Sig Dispense Refill  . buPROPion (WELLBUTRIN XL) 300 MG 24 hr tablet Take 300 mg by mouth daily.    . divalproex (DEPAKOTE ER) 500 MG 24 hr tablet Take 500 mg by mouth daily.     Marland Kitchen doxepin (SINEQUAN) 50 MG capsule Take 50 mg by mouth at bedtime.    . methocarbamol (ROBAXIN) 500 MG tablet Take 500 mg by mouth 4 (four) times daily.     . Oxycodone HCl 10 MG TABS Take 10 mg by mouth 3 (three) times daily.     . pantoprazole (PROTONIX) 40 MG tablet Take 40 mg by mouth daily as needed (heartburn).   5  . PENNSAID 2 % SOLN Apply 2 Pump topically 2 (two) times daily as needed (pain).   0  . REXULTI 2 MG TABS Take 2 mg by mouth daily.     Eduard Roux (AIMOVIG) 140 MG/ML SOAJ Inject 140 mg into the skin every  28 (twenty-eight) days. 1 pen 6  . rizatriptan (MAXALT) 10 MG tablet Take 1 tab by mouth as needed for migraine. May repeat in 2 hours if needed, max 2/day 30 tablet 1  . Amphetamine (ADZENYS XR-ODT PO) Take 2 tablets by mouth every morning.    Marland Kitchen CHANTIX CONTINUING MONTH PAK 1 MG tablet Take 1 mg by mouth 2 (two) times daily.     Marland Kitchen linaclotide (LINZESS) 290 MCG CAPS capsule Take 290 mcg by mouth daily before breakfast.     . oxyCODONE (OXYCONTIN) 20 mg 12 hr tablet Take 20 mg by mouth every 12 (twelve) hours.     . topiramate (TOPAMAX) 25 MG tablet Take 1 tab po am, 2 tabs po pm. 270 tablet 2  . TRINTELLIX 20 MG TABS tablet Take 20 mg by mouth daily.      No  facility-administered medications prior to visit.    PAST MEDICAL HISTORY: Past Medical History:  Diagnosis Date  . Anxiety and depression   . Chronic neck pain   . Fibromyalgia   . GERD (gastroesophageal reflux disease)   . Headache    migraines  . Narcotic dependence ([place])   . Sleep apnea   . Tremor     PAST SURGICAL HISTORY: Past Surgical History:  Procedure Laterality Date  . C5/C6 fusion  2010  . C5/C7 fusion  2002  . CESAREAN SECTION     times 2  . GALLBLADDER SURGERY    . REVERSE SHOULDER ARTHROPLASTY Right 06/28/2019   Procedure: REVERSE SHOULDER ARTHROPLASTY;  Surgeon: Netta Cedars, MD;  Location: [facility];  Service: Orthopedics;  Laterality: Right;  with interscalene block  . ROTATOR CUFF REPAIR Bilateral     FAMILY HISTORY: Family History  Problem Relation Age of Onset  . Cancer Mother   . Dementia Mother   . Diabetes Father   . Migraines Sister   . Fibroids Daughter     SOCIAL HISTORY: Social History   Socioeconomic History  . Marital status: Legally Separated    Spouse name: Not on file  . Number of children: 2  . Years of education: Not on file  . Highest education level: Not on file  Occupational History  . Occupation: Manufacturing engineer: [employer]  Tobacco Use  . Smoking status: Current Every Day Smoker    Packs/day: 0.25    Years: 15.00    Pack years: 3.75    Types: Cigarettes  . Smokeless tobacco: Never Used  Substance and Sexual Activity  . Alcohol use: Yes    Alcohol/week: 0.0 standard drinks    Comment: rare  . Drug use: No  . Sexual activity: Not on file  Other Topics Concern  . Not on file  Social History Narrative   Patient is separated.   Patient has 2 children   Patient lives at home with daughter.   Patient drinks caffeine everyday until 5pm.         Social Determinants of Health   Financial Resource Strain:   . Difficulty of Paying Living Expenses:   Food Insecurity:   . Worried About Charity fundraiser in  the Last Year:   . Arboriculturist in the Last Year:   Transportation Needs:   . Film/video editor (Medical):   Marland Kitchen Lack of Transportation (Non-Medical):   Physical Activity:   . Days of Exercise per Week:   . Minutes of Exercise per Session:   Stress:   . Feeling  of Stress :   Social Connections:   . Frequency of Communication with Friends and Family:   . Frequency of Social Gatherings with Friends and Family:   . Attends Religious Services:   . Active Member of Clubs or Organizations:   . Attends Archivist Meetings:   Marland Kitchen Marital Status:   Intimate Partner Violence:   . Fear of Current or Ex-Partner:   . Emotionally Abused:   Marland Kitchen Physically Abused:   . Sexually Abused:    PHYSICAL EXAM  Vitals:   04/16/20 0804  BP: 135/83  Pulse: 91  Weight: 225 lb (102.1 kg)  Height: 5\' 4"  (1.626 m)   Body mass index is 38.62 kg/m.  Generalized: Well developed, in no acute distress   Neurological examination  Mentation: Alert oriented to time, place, history taking. Follows all commands speech and language fluent Cranial nerve II-XII: Pupils were equal round reactive to light. Extraocular movements were full, visual field were full on confrontational test. Facial sensation and strength were normal. Head turning and shoulder shrug  were normal and symmetric. Motor: The motor testing reveals 5 over 5 strength of all 4 extremities. Good symmetric motor tone is noted throughout.  Sensory: Sensory testing is intact to soft touch on all 4 extremities. No evidence of extinction is noted.  Coordination: Cerebellar testing reveals good finger-nose-finger and heel-to-shin bilaterally.  Gait and station: Gait is normal.  Reflexes: Deep tendon reflexes are symmetric and normal bilaterally.   DIAGNOSTIC DATA (LABS, IMAGING, TESTING) - I reviewed patient records, labs, notes, testing and imaging myself where available.  Lab Results  Component Value Date   WBC 5.7 06/26/2019   HGB  10.4 (L) 06/29/2019   HCT 34.0 (L) 06/29/2019   MCV 95.7 06/26/2019   PLT 233 06/26/2019      Component Value Date/Time   NA 135 06/29/2019 0305   K 4.2 06/29/2019 0305   CL 104 06/29/2019 0305   CO2 22 06/29/2019 0305   GLUCOSE 153 (H) 06/29/2019 0305   BUN 10 06/29/2019 0305   CREATININE 0.66 06/29/2019 0305   CALCIUM 8.2 (L) 06/29/2019 0305   GFRNONAA >60 06/29/2019 0305   GFRAA >60 06/29/2019 0305   No results found for: CHOL, HDL, LDLCALC, LDLDIRECT, TRIG, CHOLHDL No results found for: HGBA1C No results found for: VITAMINB12 No results found for: TSH  ASSESSMENT AND PLAN 54 y.o. year old female  has a past medical history of Anxiety and depression, Chronic neck pain, Fibromyalgia, GERD (gastroesophageal reflux disease), Headache, Narcotic dependence ([place]), Sleep apnea, and Tremor. here with:  1.  Chronic migraine headache  Headaches remain well controlled.  She will remain on Aimovig 140 mg monthly injection.  She has been able to discontinue Topamax.  She will take Maxalt as needed.  She is prescribed Depakote from her psychiatrist.  She will follow-up in 1 year or sooner if needed.  I spent 20 minutes of face-to-face and non-face-to-face time with patient.  This included previsit chart review, lab review, study review, order entry, electronic health record documentation, patient education.  Butler Denmark, AGNP-C, DNP 04/16/2020, 8:27 AM [facility] [address], [place], [place] [address] [phone]

## 2020-04-16 NOTE — Progress Notes (Signed)
I have read the note, and I agree with the clinical assessment and plan.  Tinzley Dalia K Mackenzie Lia   

## 2020-04-16 NOTE — Patient Instructions (Addendum)
It was great to see you today! Continue current medications  See you back in 1 year

## 2020-04-28 ENCOUNTER — Ambulatory Visit (INDEPENDENT_AMBULATORY_CARE_PROVIDER_SITE_OTHER): Payer: BC Managed Care – PPO

## 2020-04-28 ENCOUNTER — Ambulatory Visit: Payer: BC Managed Care – PPO | Admitting: Podiatry

## 2020-04-28 ENCOUNTER — Encounter: Payer: Self-pay | Admitting: Podiatry

## 2020-04-28 ENCOUNTER — Other Ambulatory Visit: Payer: Self-pay

## 2020-04-28 DIAGNOSIS — R87612 Low grade squamous intraepithelial lesion on cytologic smear of cervix (LGSIL): Secondary | ICD-10-CM | POA: Insufficient documentation

## 2020-04-28 DIAGNOSIS — N950 Postmenopausal bleeding: Secondary | ICD-10-CM | POA: Insufficient documentation

## 2020-04-28 DIAGNOSIS — M7661 Achilles tendinitis, right leg: Secondary | ICD-10-CM

## 2020-04-28 NOTE — Progress Notes (Signed)
Subjective:  Patient ID: Nicole Bartlett, female    DOB: 03/23/1966,  MRN: [id number]  Chief Complaint  Patient presents with  . Foot Pain    Patient presents today for pain in right achilles x 5-6 weeks    54 y.o. female presents with the above complaint.  Patient presents with pain to the right Achilles tendon insertion that has been going for quite some time.  Patient states it feels tight and painful to walk on.  Is been about 5 to 6 weeks.  Is worse in the morning and from standing and sitting.  Patient also had a history of plantar fasciitis but does not appear to be bothering her.  Her pain is directly posterior aspect of the heel.  She has tried Tylenol and there is some spasm in the arch.  She has not seek any other treatment options he has not seen anyone else prior to see me.  She denies any other acute complaints.   Review of Systems: Negative except as noted in the HPI. Denies N/V/F/Ch.  Past Medical History:  Diagnosis Date  . Anxiety and depression   . Chronic neck pain   . Fibromyalgia   . GERD (gastroesophageal reflux disease)   . Headache    migraines  . Narcotic dependence ([place])   . Sleep apnea   . Tremor     Current Outpatient Medications:  .  buPROPion (WELLBUTRIN XL) 300 MG 24 hr tablet, Take 300 mg by mouth daily., Disp: , Rfl:  .  divalproex (DEPAKOTE ER) 500 MG 24 hr tablet, Take 500 mg by mouth daily. , Disp: , Rfl:  .  doxepin (SINEQUAN) 50 MG capsule, Take 50 mg by mouth at bedtime., Disp: , Rfl:  .  Erenumab-aooe (AIMOVIG) 140 MG/ML SOAJ, Inject 140 mg into the skin every 28 (twenty-eight) days., Disp: 1 pen, Rfl: 11 .  methocarbamol (ROBAXIN) 500 MG tablet, Take 500 mg by mouth 4 (four) times daily. , Disp: , Rfl:  .  Oxycodone HCl 10 MG TABS, Take 10 mg by mouth 3 (three) times daily. , Disp: , Rfl:  .  pantoprazole (PROTONIX) 40 MG tablet, Take 40 mg by mouth daily as needed (heartburn). , Disp: , Rfl: 5 .  PENNSAID 2 % SOLN, Apply 2 Pump topically  2 (two) times daily as needed (pain). , Disp: , Rfl: 0 .  REXULTI 2 MG TABS, Take 2 mg by mouth daily. , Disp: , Rfl:  .  rizatriptan (MAXALT) 10 MG tablet, Take 1 tab by mouth as needed for migraine. May repeat in 2 hours if needed, max 2/day, Disp: 30 tablet, Rfl: 1 .  XTAMPZA ER 18 MG C12A, Take 1 capsule by mouth 2 (two) times daily., Disp: , Rfl:   Social History   Tobacco Use  Smoking Status Current Every Day Smoker  . Packs/day: 0.25  . Years: 15.00  . Pack years: 3.75  . Types: Cigarettes  Smokeless Tobacco Never Used    Allergies  Allergen Reactions  . Sulfa Antibiotics Itching and Rash    angioedema   . Ibuprofen Other (See Comments)    Hx of stomach ulcers   Objective:  There were no vitals filed for this visit. There is no height or weight on file to calculate BMI. Constitutional Well developed. Well nourished.  Vascular Dorsalis pedis pulses palpable bilaterally. Posterior tibial pulses palpable bilaterally. Capillary refill normal to all digits.  No cyanosis or clubbing noted. Pedal hair growth normal.  Neurologic  Normal speech. Oriented to person, place, and time. Epicritic sensation to light touch grossly present bilaterally.  Dermatologic Nails well groomed and normal in appearance. No open wounds. No skin lesions.  Orthopedic:  Pain on palpation to the insertion of the Achilles tendon.  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  No pain at the posterior tibial tendon, peroneal tendon, ATFL.  Positive Silfverskiold test with gastroc gastroc equinus noted.  Pain with dorsiflexion of the ankle joint active and passive.  Pain relief with plantarflexion of the ankle joint active and passive.   Radiographs: None Assessment:   1. Achilles tendinitis, right leg    Plan:  Patient was evaluated and treated and all questions answered.  Right Achilles tendinitis -I explained the patient the etiology of Achilles tendinitis with underlying Haglund's deformity and  various treatment options were extensively discussed.  I believe patient will benefit from a steroid injection to help decrease the inflammatory component associated with inflammation.  Patient agrees with the plan would like to proceed with injection.  I discussed with the patient that there is a high risk of rupture associated with it given that is close proximity of the tendon.  Patient states understanding would like to still proceed with injection despite the risks. -A steroid injection was performed at right Kager's fat pad using 1% plain Lidocaine and 10 mg of Kenalog. This was well tolerated. -If there is no improvement with injection we will discuss surgical management during next clinical visit.   No follow-ups on file.

## 2020-06-01 ENCOUNTER — Encounter: Payer: Self-pay | Admitting: Podiatry

## 2020-06-01 ENCOUNTER — Ambulatory Visit: Payer: BC Managed Care – PPO | Admitting: Podiatry

## 2020-06-01 ENCOUNTER — Other Ambulatory Visit: Payer: Self-pay

## 2020-06-01 DIAGNOSIS — M7661 Achilles tendinitis, right leg: Secondary | ICD-10-CM

## 2020-06-02 ENCOUNTER — Encounter: Payer: Self-pay | Admitting: Podiatry

## 2020-06-02 NOTE — Progress Notes (Signed)
Subjective:  Patient ID: Nicole Bartlett, female    DOB: 12/24/1965,  MRN: [id number]  Chief Complaint  Patient presents with  . Ankle Pain    "its better, but I have good and bad days"    54 y.o. female presents with the above complaint. Patient is following up from right Achilles tendinitis. Patient states the surgery had shot helped considerably. Her pain is greater than 80% improved. She has pain occasionally now on and off. It has dramatically reduced from first time when she came to see me. She denies any other acute complaints. She would like to hold off on any steroid injections. Overall she is doing much better. She has made shoe gear modifications as well   Review of Systems: Negative except as noted in the HPI. Denies N/V/F/Ch.  Past Medical History:  Diagnosis Date  . Anxiety and depression   . Chronic neck pain   . Fibromyalgia   . GERD (gastroesophageal reflux disease)   . Headache    migraines  . Narcotic dependence ([place])   . Sleep apnea   . Tremor     Current Outpatient Medications:  .  buPROPion (WELLBUTRIN XL) 300 MG 24 hr tablet, Take 300 mg by mouth daily., Disp: , Rfl:  .  divalproex (DEPAKOTE ER) 500 MG 24 hr tablet, Take 500 mg by mouth daily. , Disp: , Rfl:  .  doxepin (SINEQUAN) 50 MG capsule, Take 50 mg by mouth at bedtime., Disp: , Rfl:  .  Erenumab-aooe (AIMOVIG) 140 MG/ML SOAJ, Inject 140 mg into the skin every 28 (twenty-eight) days., Disp: 1 pen, Rfl: 11 .  methocarbamol (ROBAXIN) 500 MG tablet, Take 500 mg by mouth 4 (four) times daily. , Disp: , Rfl:  .  Oxycodone HCl 10 MG TABS, Take 10 mg by mouth 3 (three) times daily. , Disp: , Rfl:  .  pantoprazole (PROTONIX) 40 MG tablet, Take 40 mg by mouth daily as needed (heartburn). , Disp: , Rfl: 5 .  PENNSAID 2 % SOLN, Apply 2 Pump topically 2 (two) times daily as needed (pain). , Disp: , Rfl: 0 .  REXULTI 2 MG TABS, Take 2 mg by mouth daily. , Disp: , Rfl:  .  rizatriptan (MAXALT) 10 MG tablet, Take  1 tab by mouth as needed for migraine. May repeat in 2 hours if needed, max 2/day, Disp: 30 tablet, Rfl: 1 .  XTAMPZA ER 18 MG C12A, Take 1 capsule by mouth 2 (two) times daily., Disp: , Rfl:   Social History   Tobacco Use  Smoking Status Current Every Day Smoker  . Packs/day: 0.25  . Years: 15.00  . Pack years: 3.75  . Types: Cigarettes  Smokeless Tobacco Never Used    Allergies  Allergen Reactions  . Sulfa Antibiotics Itching and Rash    angioedema   . Ibuprofen Other (See Comments)    Hx of stomach ulcers   Objective:  There were no vitals filed for this visit. There is no height or weight on file to calculate BMI. Constitutional Well developed. Well nourished.  Vascular Dorsalis pedis pulses palpable bilaterally. Posterior tibial pulses palpable bilaterally. Capillary refill normal to all digits.  No cyanosis or clubbing noted. Pedal hair growth normal.  Neurologic Normal speech. Oriented to person, place, and time. Epicritic sensation to light touch grossly present bilaterally.  Dermatologic Nails well groomed and normal in appearance. No open wounds. No skin lesions.  Orthopedic:  Very mild pain on palpation to the insertion of  the Achilles tendon.  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  No pain at the posterior tibial tendon, peroneal tendon, ATFL.  Positive Silfverskiold test with gastroc gastroc equinus noted. Very mild pain with dorsiflexion of the ankle joint active and passive.  Pain relief with plantarflexion of the ankle joint active and passive.   Radiographs: None Assessment:   1. Achilles tendinitis, right leg    Plan:  Patient was evaluated and treated and all questions answered.  Right Achilles tendinitis -Clinically patient's pain has completely resolved. Very mild discomfort when palpating. At this point we'll hold off on any kind of aggressive immobilization or surgical intervention. Given that patient has improved greater than 80% I do not believe  patient will benefit from another steroid injection. Patient agrees with the plan. I discussed with her that in the future if this continues to get worse we may have to consider surgical intervention at that time. Patient agrees with the plan. -In the future may also consider getting orthotics with heel lift to take the stress off of the Achilles tendon. -Heel pads were dispensed   No follow-ups on file.

## 2020-06-16 ENCOUNTER — Telehealth: Payer: Self-pay | Admitting: Neurology

## 2020-06-16 NOTE — Telephone Encounter (Signed)
Received PA request for Aimovig. PA was started on [email or web address]. Key is [id number]. Per [email or web address], determination will be made in 72 hours. Will check back for updates.

## 2020-06-22 NOTE — Telephone Encounter (Signed)
Received determination fax from [facility]. PA for Aimovig has been approved from 06/16/20 to 06/15/21. Will fax a copy of the determination to pharmacy.   Nothing further needed at time of call.

## 2020-08-06 ENCOUNTER — Other Ambulatory Visit: Payer: Self-pay | Admitting: Neurology

## 2020-08-10 ENCOUNTER — Ambulatory Visit: Payer: BC Managed Care – PPO | Admitting: Podiatry

## 2020-08-10 ENCOUNTER — Encounter: Payer: Self-pay | Admitting: Podiatry

## 2020-08-10 ENCOUNTER — Other Ambulatory Visit: Payer: Self-pay

## 2020-08-10 DIAGNOSIS — Q666 Other congenital valgus deformities of feet: Secondary | ICD-10-CM

## 2020-08-10 DIAGNOSIS — M7661 Achilles tendinitis, right leg: Secondary | ICD-10-CM | POA: Diagnosis not present

## 2020-08-10 MED ORDER — MELOXICAM 15 MG PO TABS
15.0000 mg | ORAL_TABLET | Freq: Every day | ORAL | 0 refills | Status: DC
Start: 2020-08-10 — End: 2020-08-31

## 2020-08-10 MED ORDER — METHYLPREDNISOLONE 4 MG PO TABS
ORAL_TABLET | ORAL | 0 refills | Status: DC
Start: 2020-08-10 — End: 2021-04-20

## 2020-08-11 NOTE — Progress Notes (Signed)
Subjective:  Patient ID: Nicole Bartlett, female    DOB: 1965/11/30,  MRN: [id number]  Chief Complaint  Patient presents with  . Foot Pain    achilles, right is painful again    54 y.o. female presents with the above complaint.  Patient presents with a follow-up of right Achilles tendinitis.  Patient states her pain came back.  Overall she was doing well however her pain all of a sudden started acting back up again.  She denies any other acute complaints.  She states the injection did not last that long after she left from here.  She would like to discuss future treatment options for this.   Review of Systems: Negative except as noted in the HPI. Denies N/V/F/Ch.  Past Medical History:  Diagnosis Date  . Anxiety and depression   . Chronic neck pain   . Fibromyalgia   . GERD (gastroesophageal reflux disease)   . Headache    migraines  . Narcotic dependence ([place])   . Sleep apnea   . Tremor     Current Outpatient Medications:  .  buPROPion (WELLBUTRIN XL) 300 MG 24 hr tablet, Take 300 mg by mouth daily., Disp: , Rfl:  .  divalproex (DEPAKOTE ER) 500 MG 24 hr tablet, Take 500 mg by mouth daily. , Disp: , Rfl:  .  doxepin (SINEQUAN) 50 MG capsule, Take 50 mg by mouth at bedtime., Disp: , Rfl:  .  Erenumab-aooe (AIMOVIG) 140 MG/ML SOAJ, Inject 140 mg into the skin every 28 (twenty-eight) days., Disp: 1 pen, Rfl: 11 .  methocarbamol (ROBAXIN) 500 MG tablet, Take 500 mg by mouth 4 (four) times daily. , Disp: , Rfl:  .  Oxycodone HCl 10 MG TABS, Take 10 mg by mouth 3 (three) times daily. , Disp: , Rfl:  .  pantoprazole (PROTONIX) 40 MG tablet, Take 40 mg by mouth daily as needed (heartburn). , Disp: , Rfl: 5 .  PENNSAID 2 % SOLN, Apply 2 Pump topically 2 (two) times daily as needed (pain). , Disp: , Rfl: 0 .  REXULTI 2 MG TABS, Take 2 mg by mouth daily. , Disp: , Rfl:  .  rizatriptan (MAXALT) 10 MG tablet, TAKE 1 TAB BY MOUTH AS NEEDED FOR MIGRAINE. MAY REPEAT IN 2 HOURS IF NEEDED, MAX  2/DAY, Disp: 30 tablet, Rfl: 1 .  XTAMPZA ER 18 MG C12A, Take 1 capsule by mouth 2 (two) times daily., Disp: , Rfl:  .  meloxicam (MOBIC) 15 MG tablet, Take 1 tablet (15 mg total) by mouth daily., Disp: 30 tablet, Rfl: 0 .  methylPREDNISolone (MEDROL) 4 MG tablet, Take as directed, Disp: 21 tablet, Rfl: 0  Social History   Tobacco Use  Smoking Status Current Every Day Smoker  . Packs/day: 0.25  . Years: 15.00  . Pack years: 3.75  . Types: Cigarettes  Smokeless Tobacco Never Used    Allergies  Allergen Reactions  . Sulfa Antibiotics Itching and Rash    angioedema   . Ibuprofen Other (See Comments)    Hx of stomach ulcers   Objective:  There were no vitals filed for this visit. There is no height or weight on file to calculate BMI. Constitutional Well developed. Well nourished.  Vascular Dorsalis pedis pulses palpable bilaterally. Posterior tibial pulses palpable bilaterally. Capillary refill normal to all digits.  No cyanosis or clubbing noted. Pedal hair growth normal.  Neurologic Normal speech. Oriented to person, place, and time. Epicritic sensation to light touch grossly present bilaterally.  Dermatologic Nails well groomed and normal in appearance. No open wounds. No skin lesions.  Orthopedic: pain on palpation to the insertion of the Achilles tendon.  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  No pain at the posterior tibial tendon, peroneal tendon, ATFL.  Positive Silfverskiold test with gastroc gastroc equinus noted.  pain with dorsiflexion of the ankle joint active and passive.  Pain relief with plantarflexion of the ankle joint active and passive.   Radiographs: None Assessment:   1. Achilles tendinitis, right leg   2. Pes planovalgus    Plan:  Patient was evaluated and treated and all questions answered.  Right Achilles tendinitis -I discussed with the patient extensive detail the etiology of Achilles tendinitis and various treatment options were extensively  discussed.  Given that patient has recurrence of the pain and not long-lasting effect of steroid injection in the Kager's fat pad I believe patient will benefit from aggressive cam boot immobilization with Medrol Dosepak and meloxicam.  Patient agrees with the plan. -Cam boot was dispensed  Pes planovalgus -I explained to patient the etiology of pes planovalgus and various treatment options were discussed.  I believe that patient Achilles tendon is very tight and therefore cost leading to constant pulling on the posterior heel leading to a lot of pain.  At this point patient will benefit from custom-made orthotics with a little bit of a heel lift to take the stress off of the posterior Achilles tendon. -She will be scheduled to see [facility] for custom-made orthotics with incorporation of heel lifts   No follow-ups on file.

## 2020-08-26 ENCOUNTER — Ambulatory Visit (INDEPENDENT_AMBULATORY_CARE_PROVIDER_SITE_OTHER): Payer: BC Managed Care – PPO | Admitting: Orthotics

## 2020-08-26 ENCOUNTER — Other Ambulatory Visit: Payer: Self-pay

## 2020-08-26 DIAGNOSIS — M7661 Achilles tendinitis, right leg: Secondary | ICD-10-CM

## 2020-08-26 DIAGNOSIS — Q666 Other congenital valgus deformities of feet: Secondary | ICD-10-CM

## 2020-08-26 NOTE — Progress Notes (Signed)
Patient came into today to be cast for Custom Foot Orthotics. Upon recommendation of Dr. Jeannett Senior Patient presents with acjilles tendonitis Goals are take pressure of AT at insertion Plan vendor

## 2020-08-30 ENCOUNTER — Other Ambulatory Visit: Payer: Self-pay | Admitting: Podiatry

## 2020-09-10 ENCOUNTER — Ambulatory Visit: Payer: BC Managed Care – PPO | Admitting: Podiatry

## 2020-09-15 ENCOUNTER — Other Ambulatory Visit: Payer: Self-pay

## 2020-09-15 ENCOUNTER — Encounter: Payer: Self-pay | Admitting: Podiatry

## 2020-09-15 ENCOUNTER — Ambulatory Visit: Payer: BC Managed Care – PPO | Admitting: Podiatry

## 2020-09-15 DIAGNOSIS — M7661 Achilles tendinitis, right leg: Secondary | ICD-10-CM

## 2020-09-15 DIAGNOSIS — Q666 Other congenital valgus deformities of feet: Secondary | ICD-10-CM

## 2020-09-16 ENCOUNTER — Encounter: Payer: Self-pay | Admitting: Podiatry

## 2020-09-16 NOTE — Progress Notes (Signed)
Subjective:  Patient ID: Nicole Bartlett, female    DOB: 1966/11/11,  MRN: [id number]  Chief Complaint  Patient presents with  . Foot Pain    "its slightly better"    54 y.o. female presents with the above complaint. Patient presents with follow-up of right Achilles tendinitis. Patient states her pain is about the same has not really gotten better. She does feel good in the boot however she has not been able to transition out of the boot with the brace. She denies any other acute complaints. She would like to know what the future treatment plans are.  Review of Systems: Negative except as noted in the HPI. Denies N/V/F/Ch.  Past Medical History:  Diagnosis Date  . Anxiety and depression   . Chronic neck pain   . Fibromyalgia   . GERD (gastroesophageal reflux disease)   . Headache    migraines  . Narcotic dependence ([place])   . Sleep apnea   . Tremor     Current Outpatient Medications:  .  buPROPion (WELLBUTRIN XL) 300 MG 24 hr tablet, Take 300 mg by mouth daily., Disp: , Rfl:  .  divalproex (DEPAKOTE ER) 500 MG 24 hr tablet, Take 500 mg by mouth daily. , Disp: , Rfl:  .  doxepin (SINEQUAN) 50 MG capsule, Take 50 mg by mouth at bedtime., Disp: , Rfl:  .  Erenumab-aooe (AIMOVIG) 140 MG/ML SOAJ, Inject 140 mg into the skin every 28 (twenty-eight) days., Disp: 1 pen, Rfl: 11 .  meloxicam (MOBIC) 15 MG tablet, TAKE 1 TABLET BY MOUTH EVERY DAY, Disp: 30 tablet, Rfl: 0 .  methocarbamol (ROBAXIN) 500 MG tablet, Take 500 mg by mouth 4 (four) times daily. , Disp: , Rfl:  .  methylPREDNISolone (MEDROL) 4 MG tablet, Take as directed, Disp: 21 tablet, Rfl: 0 .  Oxycodone HCl 10 MG TABS, Take 10 mg by mouth 3 (three) times daily. , Disp: , Rfl:  .  pantoprazole (PROTONIX) 40 MG tablet, Take 40 mg by mouth daily as needed (heartburn). , Disp: , Rfl: 5 .  PENNSAID 2 % SOLN, Apply 2 Pump topically 2 (two) times daily as needed (pain). , Disp: , Rfl: 0 .  REXULTI 2 MG TABS, Take 2 mg by mouth  daily. , Disp: , Rfl:  .  rizatriptan (MAXALT) 10 MG tablet, TAKE 1 TAB BY MOUTH AS NEEDED FOR MIGRAINE. MAY REPEAT IN 2 HOURS IF NEEDED, MAX 2/DAY, Disp: 30 tablet, Rfl: 1 .  XTAMPZA ER 18 MG C12A, Take 1 capsule by mouth 2 (two) times daily., Disp: , Rfl:   Social History   Tobacco Use  Smoking Status Current Every Day Smoker  . Packs/day: 0.25  . Years: 15.00  . Pack years: 3.75  . Types: Cigarettes  Smokeless Tobacco Never Used    Allergies  Allergen Reactions  . Sulfa Antibiotics Itching and Rash    angioedema   . Ibuprofen Other (See Comments)    Hx of stomach ulcers   Objective:  There were no vitals filed for this visit. There is no height or weight on file to calculate BMI. Constitutional Well developed. Well nourished.  Vascular Dorsalis pedis pulses palpable bilaterally. Posterior tibial pulses palpable bilaterally. Capillary refill normal to all digits.  No cyanosis or clubbing noted. Pedal hair growth normal.  Neurologic Normal speech. Oriented to person, place, and time. Epicritic sensation to light touch grossly present bilaterally.  Dermatologic Nails well groomed and normal in appearance. No open wounds. No skin  lesions.  Orthopedic: pain on palpation to the insertion of the Achilles tendon.  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  No pain at the posterior tibial tendon, peroneal tendon, ATFL.  Positive Silfverskiold test with gastroc gastroc equinus noted.  pain with dorsiflexion of the ankle joint active and passive.  Pain relief with plantarflexion of the ankle joint active and passive.   Radiographs: None Assessment:   1. Achilles tendinitis, right leg   2. Pes planovalgus    Plan:  Patient was evaluated and treated and all questions answered.  Right Achilles tendinitis -I discussed with the patient extensive detail the etiology of Achilles tendinitis and various treatment options were extensively discussed. I discussed with the patient in extensive  detail about future treatment plans for this as she still continues to have pain. There has been enough time passed since the for steroid injection that I discussed with her that we can attempt to do a second injection. Patient agrees with the plan and I discussed the risk of rupture associated with it. Patient agrees and would like to proceed with a second steroid injection in the Kager's fat pad. -A steroid injection was performed at right Kager's fat pad using 1% plain Lidocaine and 10 mg of Kenalog. This was well tolerated. -Continue utilizing cam boot until patient can obtain orthotics with heel lifts -If there is no improvement we will discuss getting an MRI of the right Achilles to rule out tears  Pes planovalgus -I explained to patient the etiology of pes planovalgus and various treatment options were discussed.  I believe that patient Achilles tendon is very tight and therefore cost leading to constant pulling on the posterior heel leading to a lot of pain.  At this point patient will benefit from custom-made orthotics with a little bit of a heel lift to take the stress off of the posterior Achilles tendon. -Patient is awaiting custom-made orthotics with incorporation of heel lifts.   No follow-ups on file.

## 2020-09-23 ENCOUNTER — Encounter: Payer: BC Managed Care – PPO | Admitting: Orthotics

## 2020-09-23 ENCOUNTER — Other Ambulatory Visit: Payer: Self-pay

## 2020-09-23 ENCOUNTER — Ambulatory Visit (INDEPENDENT_AMBULATORY_CARE_PROVIDER_SITE_OTHER): Payer: BC Managed Care – PPO | Admitting: Orthotics

## 2020-09-23 DIAGNOSIS — Q666 Other congenital valgus deformities of feet: Secondary | ICD-10-CM

## 2020-09-23 DIAGNOSIS — M7661 Achilles tendinitis, right leg: Secondary | ICD-10-CM

## 2020-09-23 NOTE — Progress Notes (Signed)
Patient came in today to pick up custom made foot orthotics.  The goals were accomplished and the patient reported no dissatisfaction with said orthotics.  Patient was advised of breakin period and how to report any issues.Patient came in today to pick up custom made foot orthotics.  The goals were accomplished and the patient reported no dissatisfaction with said orthotics.  Patient was advised of breakin period and how to report any issues. 

## 2020-10-03 ENCOUNTER — Other Ambulatory Visit: Payer: Self-pay | Admitting: Podiatry

## 2020-10-05 ENCOUNTER — Encounter: Payer: Self-pay | Admitting: Podiatry

## 2020-10-05 ENCOUNTER — Ambulatory Visit (INDEPENDENT_AMBULATORY_CARE_PROVIDER_SITE_OTHER): Payer: BC Managed Care – PPO | Admitting: Podiatry

## 2020-10-05 ENCOUNTER — Other Ambulatory Visit: Payer: Self-pay

## 2020-10-05 DIAGNOSIS — S86111A Strain of other muscle(s) and tendon(s) of posterior muscle group at lower leg level, right leg, initial encounter: Secondary | ICD-10-CM

## 2020-10-05 DIAGNOSIS — S86011A Strain of right Achilles tendon, initial encounter: Secondary | ICD-10-CM

## 2020-10-05 NOTE — Progress Notes (Signed)
Thank you for seeing her. I will be on the lookout for the MRI.

## 2020-10-05 NOTE — Telephone Encounter (Signed)
Please advise 

## 2020-10-05 NOTE — Progress Notes (Signed)
  Subjective:  Patient ID: Nicole Bartlett, female    DOB: 1965/12/03,  MRN: [id number]  Chief Complaint  Patient presents with  . Tendonitis    "my right achilles has been in severe pain since last night.  I was walking and all of a sudden if felt like someone kicked me in the back of the calf with steel toed shoes.  Now I cant stand on my tiptoes and it feels weak"    54 y.o. female presents with the above complaint. History confirmed with patient.  Does not recall an inciting injury just having all of a sudden when she was walking.  Reports pain superior to the heel and as well in the calf as well.  Ascending stairs has been very painful.  She has been able to put weight on it with difficulty  Objective:  Physical Exam: warm, good capillary refill, no trophic changes or ulcerative lesions, normal DP and PT pulses and normal sensory exam.   Right Foot: She has sharp pain in the Achilles midsubstance just superior to the insertion, there is a palpable delve laterally, it appears to have some medial fibers intact of the Achilles tendon, positive Thompson test noted, she also has pain in the mid calf about the level of the origin of the gastrocnemius aponeurosis  Assessment:   1. Partial rupture of Achilles tendon, right, initial encounter   2. Gastrocnemius tear, right, initial encounter      Plan:  Patient was evaluated and treated and all questions answered.  Given her history of chronic Achilles tendinitis, which could be prodromal symptom in nature, I am concerned that she has at least a partial rupture of the Achilles tendon.  On exam it seems like there are some medial fibers of the tendon still intact but unclear.  Also concerns in the mid calf that she has such pain that she may have a gastrocnemius strain or tear of the aponeurosis or fascia.  I recommend she be immobilized in a high top CAM boot with felt heel lifts to offload the tendon.  I advised her to walk as little as  possible and she likely should be partial weightbearing with support of a crutch or cane.  I am ordering a stat MRI to evaluate the tendon and calf, I expect she likely will need surgical intervention.  I will discuss her case with Dr. Posey Pronto and he will see her back after MRI for further management.  No follow-ups on file.

## 2020-10-06 ENCOUNTER — Other Ambulatory Visit: Payer: Self-pay | Admitting: Podiatry

## 2020-10-06 DIAGNOSIS — S86019A Strain of unspecified Achilles tendon, initial encounter: Secondary | ICD-10-CM

## 2020-10-10 ENCOUNTER — Other Ambulatory Visit: Payer: BC Managed Care – PPO

## 2020-10-13 ENCOUNTER — Encounter: Payer: Self-pay | Admitting: Podiatry

## 2020-10-13 ENCOUNTER — Other Ambulatory Visit: Payer: Self-pay

## 2020-10-13 ENCOUNTER — Ambulatory Visit: Payer: BC Managed Care – PPO | Admitting: Podiatry

## 2020-10-13 DIAGNOSIS — S86111A Strain of other muscle(s) and tendon(s) of posterior muscle group at lower leg level, right leg, initial encounter: Secondary | ICD-10-CM | POA: Diagnosis not present

## 2020-10-13 DIAGNOSIS — M7661 Achilles tendinitis, right leg: Secondary | ICD-10-CM | POA: Diagnosis not present

## 2020-10-14 ENCOUNTER — Encounter: Payer: Self-pay | Admitting: Podiatry

## 2020-10-14 ENCOUNTER — Ambulatory Visit
Admission: RE | Admit: 2020-10-14 | Discharge: 2020-10-14 | Disposition: A | Discharge status: Home / Self Care | Payer: BC Managed Care – PPO | Source: Ambulatory Visit | Attending: Podiatry | Admitting: Podiatry

## 2020-10-14 DIAGNOSIS — S86019A Strain of unspecified Achilles tendon, initial encounter: Secondary | ICD-10-CM

## 2020-10-14 NOTE — Progress Notes (Signed)
Subjective:  Patient ID: Nicole Bartlett, female    DOB: Sep 05, 1966,  MRN: [id number]  Chief Complaint  Patient presents with  . Tendonitis    Still having pain right achilles and heel.  My heel is hurting alot and feels like something sticking me"    54 y.o. female presents with the above complaint.  Patient presents with a follow-up of right Achilles tendon rupture partial.  Patient states is hurting a lot feels like something is sticking.  She states that is painful to touch.  She states the injury happened all of a sudden while she was walking she says superior to the heel as well into the calf.  She states it hurts a lot.  She is waiting on the MRI to get approved and she is scheduled to get the MRI done tomorrow.  She denies any other acute complaints.   Review of Systems: Negative except as noted in the HPI. Denies N/V/F/Ch.  Past Medical History:  Diagnosis Date  . Anxiety and depression   . Chronic neck pain   . Fibromyalgia   . GERD (gastroesophageal reflux disease)   . Headache    migraines  . Narcotic dependence ([place])   . Sleep apnea   . Tremor     Current Outpatient Medications:  .  buPROPion (WELLBUTRIN XL) 300 MG 24 hr tablet, Take 300 mg by mouth daily., Disp: , Rfl:  .  divalproex (DEPAKOTE ER) 500 MG 24 hr tablet, Take 500 mg by mouth daily. , Disp: , Rfl:  .  doxepin (SINEQUAN) 50 MG capsule, Take 50 mg by mouth at bedtime., Disp: , Rfl:  .  Erenumab-aooe (AIMOVIG) 140 MG/ML SOAJ, Inject 140 mg into the skin every 28 (twenty-eight) days., Disp: 1 pen, Rfl: 11 .  meloxicam (MOBIC) 15 MG tablet, TAKE 1 TABLET BY MOUTH EVERY DAY, Disp: 30 tablet, Rfl: 0 .  methocarbamol (ROBAXIN) 500 MG tablet, Take 500 mg by mouth 4 (four) times daily. , Disp: , Rfl:  .  methylPREDNISolone (MEDROL) 4 MG tablet, Take as directed, Disp: 21 tablet, Rfl: 0 .  Oxycodone HCl 10 MG TABS, Take 10 mg by mouth 3 (three) times daily. , Disp: , Rfl:  .  pantoprazole (PROTONIX) 40 MG tablet,  Take 40 mg by mouth daily as needed (heartburn). , Disp: , Rfl: 5 .  PENNSAID 2 % SOLN, Apply 2 Pump topically 2 (two) times daily as needed (pain). , Disp: , Rfl: 0 .  REXULTI 2 MG TABS, Take 2 mg by mouth daily. , Disp: , Rfl:  .  rizatriptan (MAXALT) 10 MG tablet, TAKE 1 TAB BY MOUTH AS NEEDED FOR MIGRAINE. MAY REPEAT IN 2 HOURS IF NEEDED, MAX 2/DAY, Disp: 30 tablet, Rfl: 1 .  XTAMPZA ER 18 MG C12A, Take 1 capsule by mouth 2 (two) times daily., Disp: , Rfl:   Social History   Tobacco Use  Smoking Status Current Every Day Smoker  . Packs/day: 0.25  . Years: 15.00  . Pack years: 3.75  . Types: Cigarettes  Smokeless Tobacco Never Used    Allergies  Allergen Reactions  . Sulfa Antibiotics Itching and Rash    angioedema   . Ibuprofen Other (See Comments)    Hx of stomach ulcers   Objective:  There were no vitals filed for this visit. There is no height or weight on file to calculate BMI. Constitutional Well developed. Well nourished.  Vascular Dorsalis pedis pulses palpable bilaterally. Posterior tibial pulses palpable bilaterally. Capillary  refill normal to all digits.  No cyanosis or clubbing noted. Pedal hair growth normal.  Neurologic Normal speech. Oriented to person, place, and time. Epicritic sensation to light touch grossly present bilaterally.  Dermatologic Nails well groomed and normal in appearance. No open wounds. No skin lesions.  Orthopedic:  Pain on palpation to the right Achilles tendon in the center aspect of the Achilles.  Positive hatchet strike defect noted.  Partially able to plantarflex the foot with compression of the calf.  Clinically I am able to palpate a partial rupture about 1 cm prior to the insertion of the Achilles   Radiographs: None Assessment:   1. Achilles tendinitis, right leg   2. Gastrocnemius tear, right, initial encounter    Plan:  Patient was evaluated and treated and all questions answered.  Right Achilles tendon partial  rupture with underlying gastrocnemius equinus -I explained to the patient the etiology of partial rupture emerged treatment options were discussed.  I encouraged her to be nonweightbearing to the right lower extremity given that she already has greater than 50% of the Achilles tendon thus ruptured.  The medial fibers of the Achilles tendon appear to be still intact clinically.  I will await the MRI for evaluation followed by surgical intervention to primarily repair the Achilles tendon.  I discussed this briefly with the patient.  I will plan on seeing her back again next week for surgical consult to be scheduled for surgery. -Awaiting MRI  No follow-ups on file.

## 2020-10-20 ENCOUNTER — Ambulatory Visit: Payer: BC Managed Care – PPO | Admitting: Podiatry

## 2020-10-20 ENCOUNTER — Encounter: Payer: Self-pay | Admitting: Podiatry

## 2020-10-20 ENCOUNTER — Other Ambulatory Visit: Payer: Self-pay | Admitting: Podiatry

## 2020-10-20 ENCOUNTER — Other Ambulatory Visit: Payer: Self-pay

## 2020-10-20 DIAGNOSIS — M7661 Achilles tendinitis, right leg: Secondary | ICD-10-CM | POA: Diagnosis not present

## 2020-10-20 DIAGNOSIS — M21861 Other specified acquired deformities of right lower leg: Secondary | ICD-10-CM

## 2020-10-20 DIAGNOSIS — M62461 Contracture of muscle, right lower leg: Secondary | ICD-10-CM

## 2020-10-20 DIAGNOSIS — Z01818 Encounter for other preprocedural examination: Secondary | ICD-10-CM

## 2020-10-20 DIAGNOSIS — M216X1 Other acquired deformities of right foot: Secondary | ICD-10-CM | POA: Diagnosis not present

## 2020-10-20 NOTE — Patient Instructions (Signed)
Pre-Operative Instructions  Congratulations, you have decided to take an important step towards improving your quality of life.  You can be assured that the doctors and staff at [facility] will be with you every step of the way.  Here are some important things you should know:  1. Plan to be at the surgery center/hospital at least 1 (one) hour prior to your scheduled time, unless otherwise directed by the surgical center/hospital staff.  You must have a responsible adult accompany you, remain during the surgery and drive you home.  Make sure you have directions to the surgical center/hospital to ensure you arrive on time. 2. If you are having surgery at [facility] or [facility] hospitals, you will need a copy of your medical history and physical form from your family physician within one month prior to the date of surgery. We will give you a form for your primary physician to complete.  3. We make every effort to accommodate the date you request for surgery.  However, there are times where surgery dates or times have to be moved.  We will contact you as soon as possible if a change in schedule is required.   4. No aspirin/ibuprofen for one week before surgery.  If you are on aspirin, any non-steroidal anti-inflammatory medications (Mobic, Aleve, Ibuprofen) should not be taken seven (7) days prior to your surgery.  You make take Tylenol for pain prior to surgery.  5. Medications - If you are taking daily heart and blood pressure medications, seizure, reflux, allergy, asthma, anxiety, pain or diabetes medications, make sure you notify the surgery center/hospital before the day of surgery so they can tell you which medications you should take or avoid the day of surgery. 6. No food or drink after midnight the night before surgery unless directed otherwise by surgical center/hospital staff. 7. No alcoholic beverages 24-hours prior to surgery.  No smoking 24-hours prior or 24-hours after  surgery. 8. Wear loose pants or shorts. They should be loose enough to fit over bandages, boots, and casts. 9. Don't wear slip-on shoes. Sneakers are preferred. 10. Bring your boot with you to the surgery center/hospital.  Also bring crutches or a walker if your physician has prescribed it for you.  If you do not have this equipment, it will be provided for you after surgery. 11. If you have not been contacted by the surgery center/hospital by the day before your surgery, call to confirm the date and time of your surgery. 12. Leave-time from work may vary depending on the type of surgery you have.  Appropriate arrangements should be made prior to surgery with your employer. 13. Prescriptions will be provided immediately following surgery by your doctor.  Fill these as soon as possible after surgery and take the medication as directed. Pain medications will not be refilled on weekends and must be approved by the doctor. 14. Remove nail polish on the operative foot and avoid getting pedicures prior to surgery. 15. Wash the night before surgery.  The night before surgery wash the foot and leg well with water and the antibacterial soap provided. Be sure to pay special attention to beneath the toenails and in between the toes.  Wash for at least three (3) minutes. Rinse thoroughly with water and dry well with a towel.  Perform this wash unless told not to do so by your physician.  Enclosed: 1 Ice pack (please put in freezer the night before surgery)   1 Hibiclens skin cleaner     Pre-op instructions  If you have any questions regarding the instructions, please do not hesitate to call our office.  [place]: [address], [place], [place] [address] -- [phone]  [place]: [address] [place], [place] [address] -- [phone]  [place]: [address], [place], [place] [address] -- [phone]   Website: [email or web address] 

## 2020-10-21 ENCOUNTER — Encounter: Payer: Self-pay | Admitting: Podiatry

## 2020-10-21 NOTE — Progress Notes (Signed)
Subjective:  Patient ID: Nicole Bartlett, female    DOB: 12-Dec-1965,  MRN: [id number]  Chief Complaint  Patient presents with  . Tendonitis    here to discuss MRI results    54 y.o. female presents with the above complaint.  Patient presents with a follow-up of right Achilles tendon rupture partial.  Patient states is hurting a lot feels like something is sticking.  She states that is painful to touch.  She states the injury happened all of a sudden while she was walking she says superior to the heel as well into the calf.  She states it hurts a lot.  Patient has completed the MRI and is here to be scheduled for surgery.   Review of Systems: Negative except as noted in the HPI. Denies N/V/F/Ch.  Past Medical History:  Diagnosis Date  . Anxiety and depression   . Chronic neck pain   . Fibromyalgia   . GERD (gastroesophageal reflux disease)   . Headache    migraines  . Narcotic dependence ([place])   . Sleep apnea   . Tremor     Current Outpatient Medications:  .  buPROPion (WELLBUTRIN XL) 300 MG 24 hr tablet, Take 300 mg by mouth daily., Disp: , Rfl:  .  divalproex (DEPAKOTE ER) 500 MG 24 hr tablet, Take 500 mg by mouth daily. , Disp: , Rfl:  .  doxepin (SINEQUAN) 50 MG capsule, Take 50 mg by mouth at bedtime., Disp: , Rfl:  .  Erenumab-aooe (AIMOVIG) 140 MG/ML SOAJ, Inject 140 mg into the skin every 28 (twenty-eight) days., Disp: 1 pen, Rfl: 11 .  meloxicam (MOBIC) 15 MG tablet, TAKE 1 TABLET BY MOUTH EVERY DAY, Disp: 30 tablet, Rfl: 0 .  methocarbamol (ROBAXIN) 500 MG tablet, Take 500 mg by mouth 4 (four) times daily. , Disp: , Rfl:  .  methylPREDNISolone (MEDROL) 4 MG tablet, Take as directed, Disp: 21 tablet, Rfl: 0 .  Oxycodone HCl 10 MG TABS, Take 10 mg by mouth 3 (three) times daily. , Disp: , Rfl:  .  pantoprazole (PROTONIX) 40 MG tablet, Take 40 mg by mouth daily as needed (heartburn). , Disp: , Rfl: 5 .  PENNSAID 2 % SOLN, Apply 2 Pump topically 2 (two) times daily as  needed (pain). , Disp: , Rfl: 0 .  REXULTI 2 MG TABS, Take 2 mg by mouth daily. , Disp: , Rfl:  .  rizatriptan (MAXALT) 10 MG tablet, TAKE 1 TAB BY MOUTH AS NEEDED FOR MIGRAINE. MAY REPEAT IN 2 HOURS IF NEEDED, MAX 2/DAY, Disp: 30 tablet, Rfl: 1 .  XTAMPZA ER 18 MG C12A, Take 1 capsule by mouth 2 (two) times daily., Disp: , Rfl:   Social History   Tobacco Use  Smoking Status Current Every Day Smoker  . Packs/day: 0.25  . Years: 15.00  . Pack years: 3.75  . Types: Cigarettes  Smokeless Tobacco Never Used    Allergies  Allergen Reactions  . Sulfa Antibiotics Itching and Rash    angioedema   . Ibuprofen Other (See Comments)    Hx of stomach ulcers   Objective:  There were no vitals filed for this visit. There is no height or weight on file to calculate BMI. Constitutional Well developed. Well nourished.  Vascular Dorsalis pedis pulses palpable bilaterally. Posterior tibial pulses palpable bilaterally. Capillary refill normal to all digits.  No cyanosis or clubbing noted. Pedal hair growth normal.  Neurologic Normal speech. Oriented to person, place, and time. Epicritic sensation  to light touch grossly present bilaterally.  Dermatologic Nails well groomed and normal in appearance. No open wounds. No skin lesions.  Orthopedic:  Pain on palpation to the right Achilles tendon in the center aspect of the Achilles.  Positive hatchet strike defect noted.  Partially able to plantarflex the foot with compression of the calf.  Clinically I am able to palpate a partial rupture about 1 cm prior to the insertion of the Achilles.  Positive Silfverskiold test with gastrocnemius equinus   Radiographs:  IMPRESSION: 1. High-grade partial-thickness retracted tear involving the lateral half of the Achilles tendon. The medial fibers are still intact. 2. Short segment longitudinal split type tear involving the peroneus brevis tendon proximally. Mild tenosynovitis. 3. Moderate tenosynovitis  involving the posterior tibialis tendon. 4. Chronically torn anterior talofibular ligament. 5. No acute bony findings. Assessment:   1. Achilles tendinitis, right leg   2. Gastrocnemius equinus, right   3. Preoperative examination    Plan:  Patient was evaluated and treated and all questions answered.  Right Achilles tendon partial rupture with underlying gastrocnemius equinus -I explained to the patient the etiology of partial rupture emerged treatment options were discussed.  I encouraged her to be nonweightbearing to the right lower extremity given that she already has greater than 50% of the Achilles tendon thus ruptured.  The medial fibers of the Achilles tendon appear to be still intact clinically.  -MRI was evaluated and discussed with patient extensive detail it appears that patient has high-grade partial-thickness tear on the lateral half of the tendon that is retracted 2-1/2 cm proximally.  Given the rupture that is acute in nature I discussed my surgical plan with the patient in extensive detail.  I believe patient will benefit from primary repair of the tendon with debridement of the both mop ends of the tendon as well as gastrocnemius recession to address the underlying equinus contracture..  I discussed my surgical plan as well as my postop protocol in extensive detail.  She will be nonweightbearing in a cast for 6 weeks followed by transition to weightbearing as tolerated in cam boot.  I will discuss physical therapy afterwards as well. -Patient agrees with the plan would like to proceed with the surgery. -Informed surgical risk consent was reviewed and read aloud to the patient.  I reviewed the films.  I have discussed my findings with the patient in great detail.  I have discussed all risks including but not limited to infection, stiffness, scarring, limp, disability, deformity, damage to blood vessels and nerves, numbness, poor healing, need for braces, arthritis, chronic pain,  amputation, death.  All benefits and realistic expectations discussed in great detail.  I have made no promises as to the outcome.  I have provided realistic expectations.  I have offered the patient a 2nd opinion, which they have declined and assured me they preferred to proceed despite the risks -A total of 33 minutes was spent in direct patient care as well as pre and post patient encounter activities.  This includes documentation as well as reviewing patient chart for labs, imaging, past medical, surgical, social, and family history as documented in the EMR.  I have reviewed medication allergies as documented in EMR.  I discussed the etiology of condition and treatment options from conservative to surgical care.  All risks and benefit of the treatment course was discussed in detail.  All questions were answered and return appointment was discussed.  Since the visit completed in an ambulatory/outpatient setting, the patient and/or  parent/guardian has been advised to contact the providers office for worsening condition and seek medical treatment and/or call 911 if the patient deems either is necessary.   No follow-ups on file.

## 2020-10-23 ENCOUNTER — Telehealth: Payer: Self-pay | Admitting: Podiatry

## 2020-10-23 NOTE — Telephone Encounter (Signed)
DOS: 11/09/2020  Procedure: Repair Achilles Tendon Rt ([id number])  [employer] Effective From: 11/22/2019 - 11/20/2020  Deductible: $300 with $35.91 met and $264.09 remaining. Out of Pocket: $3,500 with $3,476.65 met and $23.35 remaining. CoInsurance: 20% Copay: $0  Per [employer] no Prior Authorization is required.

## 2020-11-06 DIAGNOSIS — M79676 Pain in unspecified toe(s): Secondary | ICD-10-CM

## 2020-11-09 DIAGNOSIS — M7661 Achilles tendinitis, right leg: Secondary | ICD-10-CM | POA: Diagnosis not present

## 2020-11-09 DIAGNOSIS — M216X1 Other acquired deformities of right foot: Secondary | ICD-10-CM

## 2020-11-09 MED ORDER — OXYCODONE-ACETAMINOPHEN 5-325 MG PO TABS: 1.0000 | ORAL_TABLET | ORAL | 0 refills | Status: DC | PRN

## 2020-11-09 NOTE — Addendum Note (Signed)
Addended by: Boneta Lucks on: 11/09/2020 07:36 AM   Modules accepted: Orders

## 2020-11-17 ENCOUNTER — Encounter: Payer: BC Managed Care – PPO | Admitting: Podiatry

## 2020-11-18 ENCOUNTER — Ambulatory Visit (INDEPENDENT_AMBULATORY_CARE_PROVIDER_SITE_OTHER): Payer: BC Managed Care – PPO | Admitting: Podiatry

## 2020-11-18 ENCOUNTER — Encounter: Payer: Self-pay | Admitting: Podiatry

## 2020-11-18 ENCOUNTER — Other Ambulatory Visit: Payer: Self-pay

## 2020-11-18 DIAGNOSIS — M21861 Other specified acquired deformities of right lower leg: Secondary | ICD-10-CM

## 2020-11-18 DIAGNOSIS — M7661 Achilles tendinitis, right leg: Secondary | ICD-10-CM

## 2020-11-18 DIAGNOSIS — Z9889 Other specified postprocedural states: Secondary | ICD-10-CM

## 2020-11-18 DIAGNOSIS — M216X1 Other acquired deformities of right foot: Secondary | ICD-10-CM

## 2020-11-18 NOTE — Progress Notes (Signed)
Subjective:  Patient ID: Nicole Bartlett, female    DOB: [birth year],  MRN: [id number]  Chief Complaint  Patient presents with  . Routine Post Op    PT stated that she is doing well and she has no concerns at this time.    DOS: 11/09/2020 Procedure: Achilles tendon rupture repair  54 y.o. female returns for post-op check.  Patient is doing well.  She states that her pain is well controlled.  She does not need any further pain medication.  She has been doing well with the cast nonweightbearing with a knee scooter.  Cast is clean dry and intact.  Review of Systems: Negative except as noted in the HPI. Denies N/V/F/Ch.  Past Medical History:  Diagnosis Date  . Anxiety and depression   . Chronic neck pain   . Fibromyalgia   . GERD (gastroesophageal reflux disease)   . Headache    migraines  . Narcotic dependence ([place])   . Sleep apnea   . Tremor     Current Outpatient Medications:  .  buPROPion (WELLBUTRIN XL) 300 MG 24 hr tablet, Take 300 mg by mouth daily., Disp: , Rfl:  .  divalproex (DEPAKOTE ER) 500 MG 24 hr tablet, Take 500 mg by mouth daily. , Disp: , Rfl:  .  doxepin (SINEQUAN) 50 MG capsule, Take 50 mg by mouth at bedtime., Disp: , Rfl:  .  Erenumab-aooe (AIMOVIG) 140 MG/ML SOAJ, Inject 140 mg into the skin every 28 (twenty-eight) days., Disp: 1 pen, Rfl: 11 .  meloxicam (MOBIC) 15 MG tablet, TAKE 1 TABLET BY MOUTH EVERY DAY, Disp: 30 tablet, Rfl: 0 .  methocarbamol (ROBAXIN) 500 MG tablet, Take 500 mg by mouth 4 (four) times daily. , Disp: , Rfl:  .  methylPREDNISolone (MEDROL) 4 MG tablet, Take as directed, Disp: 21 tablet, Rfl: 0 .  Oxycodone HCl 10 MG TABS, Take 10 mg by mouth 3 (three) times daily. , Disp: , Rfl:  .  oxyCODONE-acetaminophen (PERCOCET) 5-325 MG tablet, Take 1-2 tablets by mouth every 4 (four) hours as needed for severe pain., Disp: 30 tablet, Rfl: 0 .  pantoprazole (PROTONIX) 40 MG tablet, Take 40 mg by mouth daily as needed (heartburn). , Disp: , Rfl:  5 .  PENNSAID 2 % SOLN, Apply 2 Pump topically 2 (two) times daily as needed (pain). , Disp: , Rfl: 0 .  REXULTI 2 MG TABS, Take 2 mg by mouth daily. , Disp: , Rfl:  .  rizatriptan (MAXALT) 10 MG tablet, TAKE 1 TAB BY MOUTH AS NEEDED FOR MIGRAINE. MAY REPEAT IN 2 HOURS IF NEEDED, MAX 2/DAY, Disp: 30 tablet, Rfl: 1 .  XTAMPZA ER 18 MG C12A, Take 1 capsule by mouth 2 (two) times daily., Disp: , Rfl:   Social History   Tobacco Use  Smoking Status Current Every Day Smoker  . Packs/day: 0.25  . Years: 15.00  . Pack years: 3.75  . Types: Cigarettes  Smokeless Tobacco Never Used    Allergies  Allergen Reactions  . Sulfa Antibiotics Itching and Rash    angioedema   . Ibuprofen Other (See Comments)    Hx of stomach ulcers   Objective:  There were no vitals filed for this visit. There is no height or weight on file to calculate BMI. Constitutional Well developed. Well nourished.  Vascular Foot warm and well perfused. Capillary refill normal to all digits.   Neurologic Normal speech. Oriented to person, place, and time. Epicritic sensation to light touch grossly  present bilaterally.  Dermatologic Skin healing well without signs of infection. Skin edges well coapted without signs of infection.  Orthopedic: Tenderness to palpation noted about the surgical site.   Radiographs: None Assessment:   1. Achilles tendinitis, right leg   2. Gastrocnemius equinus, right   3. Status post foot surgery    Plan:  Patient was evaluated and treated and all questions answered.  S/p foot surgery right -Progressing as expected post-operatively. -XR: None -WB Status: Nonweightbearing to the right lower extremity in in cam boot -Sutures/staples: Intact.  No signs of dehiscence noted.  No clinical signs of infection noted. -Medications: None -Foot redressed.  No follow-ups on file.

## 2020-11-24 ENCOUNTER — Telehealth: Payer: Self-pay | Admitting: Podiatry

## 2020-11-24 MED ORDER — DOXYCYCLINE HYCLATE 100 MG PO TABS: 100.0000 mg | ORAL_TABLET | Freq: Two times a day (BID) | ORAL | 0 refills | Status: DC

## 2020-11-24 NOTE — Telephone Encounter (Signed)
Sent it in again.

## 2020-11-24 NOTE — Telephone Encounter (Signed)
Pt states her pharmacy never received an order for the doxycyline. Please advise.

## 2020-11-24 NOTE — Addendum Note (Signed)
Addended by: Nicholes Rough on: 11/24/2020 01:37 PM   Modules accepted: Orders

## 2020-12-01 ENCOUNTER — Encounter: Payer: BC Managed Care – PPO | Admitting: Podiatry

## 2020-12-02 ENCOUNTER — Ambulatory Visit (INDEPENDENT_AMBULATORY_CARE_PROVIDER_SITE_OTHER): Payer: BC Managed Care – PPO | Admitting: Podiatry

## 2020-12-02 ENCOUNTER — Other Ambulatory Visit: Payer: Self-pay

## 2020-12-02 ENCOUNTER — Encounter: Payer: Self-pay | Admitting: Podiatry

## 2020-12-02 DIAGNOSIS — M21861 Other specified acquired deformities of right lower leg: Secondary | ICD-10-CM

## 2020-12-02 DIAGNOSIS — M7661 Achilles tendinitis, right leg: Secondary | ICD-10-CM

## 2020-12-02 DIAGNOSIS — Z9889 Other specified postprocedural states: Secondary | ICD-10-CM

## 2020-12-02 DIAGNOSIS — M216X1 Other acquired deformities of right foot: Secondary | ICD-10-CM

## 2020-12-02 NOTE — Progress Notes (Signed)
Subjective:  Patient ID: Nicole Bartlett, female    DOB: May 18, 1966,  MRN: [id number]  Chief Complaint  Patient presents with  . Routine Post Op    Post op Achilles tendinitis  DOS 12.20.21     DOS: 11/09/2020 Procedure: Achilles tendon rupture repair  55 y.o. female returns for post-op check.  Patient is doing well.  She states that her pain is well controlled.  She does not need any further pain medication.  She has been well nonweightbearing to the lower extremity with a knee scooter.  Review of Systems: Negative except as noted in the HPI. Denies N/V/F/Ch.  Past Medical History:  Diagnosis Date  . Anxiety and depression   . Chronic neck pain   . Fibromyalgia   . GERD (gastroesophageal reflux disease)   . Headache    migraines  . Narcotic dependence ([place])   . Sleep apnea   . Tremor     Current Outpatient Medications:  .  buPROPion (WELLBUTRIN XL) 300 MG 24 hr tablet, Take 300 mg by mouth daily., Disp: , Rfl:  .  divalproex (DEPAKOTE ER) 500 MG 24 hr tablet, Take 500 mg by mouth daily. , Disp: , Rfl:  .  doxepin (SINEQUAN) 50 MG capsule, Take 50 mg by mouth at bedtime., Disp: , Rfl:  .  doxycycline (VIBRA-TABS) 100 MG tablet, Take 1 tablet (100 mg total) by mouth 2 (two) times daily., Disp: 20 tablet, Rfl: 0 .  Erenumab-aooe (AIMOVIG) 140 MG/ML SOAJ, Inject 140 mg into the skin every 28 (twenty-eight) days., Disp: 1 pen, Rfl: 11 .  meloxicam (MOBIC) 15 MG tablet, TAKE 1 TABLET BY MOUTH EVERY DAY, Disp: 30 tablet, Rfl: 0 .  methocarbamol (ROBAXIN) 500 MG tablet, Take 500 mg by mouth 4 (four) times daily. , Disp: , Rfl:  .  methylPREDNISolone (MEDROL) 4 MG tablet, Take as directed, Disp: 21 tablet, Rfl: 0 .  Oxycodone HCl 10 MG TABS, Take 10 mg by mouth 3 (three) times daily. , Disp: , Rfl:  .  oxyCODONE-acetaminophen (PERCOCET) 5-325 MG tablet, Take 1-2 tablets by mouth every 4 (four) hours as needed for severe pain., Disp: 30 tablet, Rfl: 0 .  pantoprazole (PROTONIX) 40 MG  tablet, Take 40 mg by mouth daily as needed (heartburn). , Disp: , Rfl: 5 .  PENNSAID 2 % SOLN, Apply 2 Pump topically 2 (two) times daily as needed (pain). , Disp: , Rfl: 0 .  REXULTI 2 MG TABS, Take 2 mg by mouth daily. , Disp: , Rfl:  .  rizatriptan (MAXALT) 10 MG tablet, TAKE 1 TAB BY MOUTH AS NEEDED FOR MIGRAINE. MAY REPEAT IN 2 HOURS IF NEEDED, MAX 2/DAY, Disp: 30 tablet, Rfl: 1 .  XTAMPZA ER 18 MG C12A, Take 1 capsule by mouth 2 (two) times daily., Disp: , Rfl:   Social History   Tobacco Use  Smoking Status Current Every Day Smoker  . Packs/day: 0.25  . Years: 15.00  . Pack years: 3.75  . Types: Cigarettes  Smokeless Tobacco Never Used    Allergies  Allergen Reactions  . Sulfa Antibiotics Itching and Rash    angioedema   . Ibuprofen Other (See Comments)    Hx of stomach ulcers   Objective:  There were no vitals filed for this visit. There is no height or weight on file to calculate BMI. Constitutional Well developed. Well nourished.  Vascular Foot warm and well perfused. Capillary refill normal to all digits.   Neurologic Normal speech. Oriented to  person, place, and time. Epicritic sensation to light touch grossly present bilaterally.  Dermatologic Skin healing well without signs of infection. Skin edges well coapted without signs of infection.  Orthopedic: Tenderness to palpation noted about the surgical site.   Radiographs: None Assessment:   1. Achilles tendinitis, right leg   2. Gastrocnemius equinus, right   3. Status post foot surgery    Plan:  Patient was evaluated and treated and all questions answered.  S/p foot surgery right -Progressing as expected post-operatively. -XR: None -WB Status: Nonweightbearing to the right lower extremity in in cam boot -Sutures/staples: Removed no signs of dehiscence noted.  No clinical signs of infection noted. -Medications: None -Foot redressed.  No follow-ups on file.

## 2020-12-04 DIAGNOSIS — M79676 Pain in unspecified toe(s): Secondary | ICD-10-CM

## 2020-12-23 ENCOUNTER — Other Ambulatory Visit: Payer: Self-pay

## 2020-12-23 ENCOUNTER — Ambulatory Visit (INDEPENDENT_AMBULATORY_CARE_PROVIDER_SITE_OTHER): Payer: BC Managed Care – PPO | Admitting: Podiatry

## 2020-12-23 DIAGNOSIS — Z9889 Other specified postprocedural states: Secondary | ICD-10-CM

## 2020-12-23 DIAGNOSIS — M7661 Achilles tendinitis, right leg: Secondary | ICD-10-CM

## 2020-12-23 DIAGNOSIS — M216X1 Other acquired deformities of right foot: Secondary | ICD-10-CM

## 2020-12-23 DIAGNOSIS — M21861 Other specified acquired deformities of right lower leg: Secondary | ICD-10-CM

## 2020-12-24 ENCOUNTER — Telehealth: Payer: Self-pay | Admitting: Podiatry

## 2020-12-24 NOTE — Telephone Encounter (Signed)
Pt called stating the boot is causing pain in her heel when she walks on it. She states it feels like she's walking on rocks. She also states when she wears her tennis shoes instead, she feels more relief. She would like to know if the pain she's experiencing is normal, and if you had any advice as far as what to do for the pain and next steps in treatment. Please advise.

## 2020-12-24 NOTE — Telephone Encounter (Signed)
That is normal.  However, if her pain continues to worsen she can may be slowly start transition to regular shoes as well.  But I would prefer her to be in a boot for a little while before we transition her to regular shoes.

## 2020-12-29 ENCOUNTER — Encounter: Payer: Self-pay | Admitting: Podiatry

## 2020-12-29 NOTE — Progress Notes (Signed)
Subjective:  Patient ID: Nicole Bartlett, female    DOB: April 22, 1966,  MRN: [id number]  Chief Complaint  Patient presents with  . Follow-up    6 WEEK FOLLOW UP- PT STATES SHE IS DOING OKAY AND NOT HAVING ANY PAIN     DOS: 11/09/2020 Procedure: Achilles tendon rupture repair  55 y.o. female returns for post-op check.  She states she is doing well overall.  She does not have any pain.  She states that she has not put any weight down on the foot.  She would like to know if she can start putting some more weight down.  She denies any other acute complaints.  Review of Systems: Negative except as noted in the HPI. Denies N/V/F/Ch.  Past Medical History:  Diagnosis Date  . Anxiety and depression   . Chronic neck pain   . Fibromyalgia   . GERD (gastroesophageal reflux disease)   . Headache    migraines  . Narcotic dependence ([place])   . Sleep apnea   . Tremor     Current Outpatient Medications:  .  buPROPion (WELLBUTRIN XL) 300 MG 24 hr tablet, Take 300 mg by mouth daily., Disp: , Rfl:  .  divalproex (DEPAKOTE ER) 500 MG 24 hr tablet, Take 500 mg by mouth daily. , Disp: , Rfl:  .  doxepin (SINEQUAN) 50 MG capsule, Take 50 mg by mouth at bedtime., Disp: , Rfl:  .  doxycycline (VIBRA-TABS) 100 MG tablet, Take 1 tablet (100 mg total) by mouth 2 (two) times daily., Disp: 20 tablet, Rfl: 0 .  Erenumab-aooe (AIMOVIG) 140 MG/ML SOAJ, Inject 140 mg into the skin every 28 (twenty-eight) days., Disp: 1 pen, Rfl: 11 .  meloxicam (MOBIC) 15 MG tablet, TAKE 1 TABLET BY MOUTH EVERY DAY, Disp: 30 tablet, Rfl: 0 .  methocarbamol (ROBAXIN) 500 MG tablet, Take 500 mg by mouth 4 (four) times daily. , Disp: , Rfl:  .  methylPREDNISolone (MEDROL) 4 MG tablet, Take as directed, Disp: 21 tablet, Rfl: 0 .  Oxycodone HCl 10 MG TABS, Take 10 mg by mouth 3 (three) times daily. , Disp: , Rfl:  .  oxyCODONE-acetaminophen (PERCOCET) 5-325 MG tablet, Take 1-2 tablets by mouth every 4 (four) hours as needed for severe  pain., Disp: 30 tablet, Rfl: 0 .  pantoprazole (PROTONIX) 40 MG tablet, Take 40 mg by mouth daily as needed (heartburn). , Disp: , Rfl: 5 .  PENNSAID 2 % SOLN, Apply 2 Pump topically 2 (two) times daily as needed (pain). , Disp: , Rfl: 0 .  REXULTI 2 MG TABS, Take 2 mg by mouth daily. , Disp: , Rfl:  .  rizatriptan (MAXALT) 10 MG tablet, TAKE 1 TAB BY MOUTH AS NEEDED FOR MIGRAINE. MAY REPEAT IN 2 HOURS IF NEEDED, MAX 2/DAY, Disp: 30 tablet, Rfl: 1 .  XTAMPZA ER 18 MG C12A, Take 1 capsule by mouth 2 (two) times daily., Disp: , Rfl:   Social History   Tobacco Use  Smoking Status Current Every Day Smoker  . Packs/day: 0.25  . Years: 15.00  . Pack years: 3.75  . Types: Cigarettes  Smokeless Tobacco Never Used    Allergies  Allergen Reactions  . Sulfa Antibiotics Itching and Rash    angioedema   . Ibuprofen Other (See Comments)    Hx of stomach ulcers   Objective:  There were no vitals filed for this visit. There is no height or weight on file to calculate BMI. Constitutional Well developed. Well nourished.  Vascular Foot warm and well perfused. Capillary refill normal to all digits.   Neurologic Normal speech. Oriented to person, place, and time. Epicritic sensation to light touch grossly present bilaterally.  Dermatologic  skin well reepithelialized.  Good correction alignment noted.  Great range of motion improvement noted.  Good manual muscle strength noted of the Achilles tendon which is 5 out of 5 for now.  Orthopedic: Tenderness to palpation noted about the surgical site.   Radiographs: None Assessment:   1. Achilles tendinitis, right leg   2. Gastrocnemius equinus, right   3. Status post foot surgery    Plan:  Patient was evaluated and treated and all questions answered.  S/p foot surgery right -Progressing as expected post-operatively. -XR: None -WB Status: Encouraged her to start transition to weightbearing as tolerated in cam boot.  She states  understanding -Sutures/staples: None -Medications: None -If there is no improvement and the transition process we will discuss physical therapy at that time.  No follow-ups on file.

## 2021-01-08 ENCOUNTER — Ambulatory Visit (INDEPENDENT_AMBULATORY_CARE_PROVIDER_SITE_OTHER): Payer: BC Managed Care – PPO | Admitting: Podiatry

## 2021-01-08 ENCOUNTER — Encounter: Payer: Self-pay | Admitting: Podiatry

## 2021-01-08 ENCOUNTER — Other Ambulatory Visit: Payer: Self-pay

## 2021-01-08 DIAGNOSIS — M21861 Other specified acquired deformities of right lower leg: Secondary | ICD-10-CM

## 2021-01-08 DIAGNOSIS — M216X1 Other acquired deformities of right foot: Secondary | ICD-10-CM

## 2021-01-08 DIAGNOSIS — M7661 Achilles tendinitis, right leg: Secondary | ICD-10-CM

## 2021-01-08 DIAGNOSIS — Z9889 Other specified postprocedural states: Secondary | ICD-10-CM

## 2021-01-08 NOTE — Progress Notes (Signed)
Subjective:  Patient ID: Nicole Bartlett, female    DOB: 1966-08-03,  MRN: [id number]  Chief Complaint  Patient presents with  . Routine Post Op    POST OP Achilles tendinitis     DOS: 11/09/2020 Procedure: Achilles tendon rupture repair  55 y.o. female returns for post-op check.  She states she is doing well overall.  She does not have any pain.  She has been bearing full weight down.  She denies any other acute complaints.  She has been able to transition to regular sneakers.  Review of Systems: Negative except as noted in the HPI. Denies N/V/F/Ch.  Past Medical History:  Diagnosis Date  . Anxiety and depression   . Chronic neck pain   . Fibromyalgia   . GERD (gastroesophageal reflux disease)   . Headache    migraines  . Narcotic dependence ([place])   . Sleep apnea   . Tremor     Current Outpatient Medications:  .  buPROPion (WELLBUTRIN XL) 300 MG 24 hr tablet, Take 300 mg by mouth daily., Disp: , Rfl:  .  divalproex (DEPAKOTE ER) 500 MG 24 hr tablet, Take 500 mg by mouth daily. , Disp: , Rfl:  .  doxepin (SINEQUAN) 50 MG capsule, Take 50 mg by mouth at bedtime., Disp: , Rfl:  .  doxycycline (VIBRA-TABS) 100 MG tablet, Take 1 tablet (100 mg total) by mouth 2 (two) times daily., Disp: 20 tablet, Rfl: 0 .  Erenumab-aooe (AIMOVIG) 140 MG/ML SOAJ, Inject 140 mg into the skin every 28 (twenty-eight) days., Disp: 1 pen, Rfl: 11 .  meloxicam (MOBIC) 15 MG tablet, TAKE 1 TABLET BY MOUTH EVERY DAY, Disp: 30 tablet, Rfl: 0 .  methocarbamol (ROBAXIN) 500 MG tablet, Take 500 mg by mouth 4 (four) times daily. , Disp: , Rfl:  .  methylPREDNISolone (MEDROL) 4 MG tablet, Take as directed, Disp: 21 tablet, Rfl: 0 .  Oxycodone HCl 10 MG TABS, Take 10 mg by mouth 3 (three) times daily. , Disp: , Rfl:  .  oxyCODONE-acetaminophen (PERCOCET) 5-325 MG tablet, Take 1-2 tablets by mouth every 4 (four) hours as needed for severe pain., Disp: 30 tablet, Rfl: 0 .  pantoprazole (PROTONIX) 40 MG tablet,  Take 40 mg by mouth daily as needed (heartburn). , Disp: , Rfl: 5 .  PENNSAID 2 % SOLN, Apply 2 Pump topically 2 (two) times daily as needed (pain). , Disp: , Rfl: 0 .  REXULTI 2 MG TABS, Take 2 mg by mouth daily. , Disp: , Rfl:  .  rizatriptan (MAXALT) 10 MG tablet, TAKE 1 TAB BY MOUTH AS NEEDED FOR MIGRAINE. MAY REPEAT IN 2 HOURS IF NEEDED, MAX 2/DAY, Disp: 30 tablet, Rfl: 1 .  XTAMPZA ER 18 MG C12A, Take 1 capsule by mouth 2 (two) times daily., Disp: , Rfl:   Social History   Tobacco Use  Smoking Status Current Every Day Smoker  . Packs/day: 0.25  . Years: 15.00  . Pack years: 3.75  . Types: Cigarettes  Smokeless Tobacco Never Used    Allergies  Allergen Reactions  . Sulfa Antibiotics Itching and Rash    angioedema   . Ibuprofen Other (See Comments)    Hx of stomach ulcers   Objective:  There were no vitals filed for this visit. There is no height or weight on file to calculate BMI. Constitutional Well developed. Well nourished.  Vascular Foot warm and well perfused. Capillary refill normal to all digits.   Neurologic Normal speech. Oriented to  person, place, and time. Epicritic sensation to light touch grossly present bilaterally.  Dermatologic  skin well reepithelialized.  Good correction alignment noted.  Great range of motion improvement noted.  Good manual muscle strength noted of the Achilles tendon which is 5 out of 5 for now.  Good range of motion noted of the ankle joint.  Achilles tendon intact.  Orthopedic: Tenderness to palpation noted about the surgical site.   Radiographs: None Assessment:   1. Achilles tendinitis, right leg   2. Gastrocnemius equinus, right   3. Status post foot surgery    Plan:  Patient was evaluated and treated and all questions answered.  S/p foot surgery right -Progressing as expected post-operatively. -XR: None -WB Status: Weightbearing as tolerated in regular shoes -Sutures/staples: None -Medications: None -Range of motion  and gait has improved considerably.  I will hold off on physical therapy for now.  At this time given that patient is able to return to regular shoes without any instability I have officially discharged from my care.  If any foot and ankle issues arises in the future come back and see me.  She states understanding  No follow-ups on file.

## 2021-04-20 ENCOUNTER — Encounter: Payer: Self-pay | Admitting: Neurology

## 2021-04-20 ENCOUNTER — Ambulatory Visit: Payer: BC Managed Care – PPO | Admitting: Neurology

## 2021-04-20 VITALS — BP 124/80 | HR 90 | Ht 64.0 in | Wt 226.0 lb

## 2021-04-20 DIAGNOSIS — G43909 Migraine, unspecified, not intractable, without status migrainosus: Secondary | ICD-10-CM | POA: Diagnosis not present

## 2021-04-20 MED ORDER — AIMOVIG 140 MG/ML ~~LOC~~ SOAJ
140.0000 mg | SUBCUTANEOUS | 11 refills | Status: DC
Start: 2021-04-20 — End: 2022-07-21

## 2021-04-20 MED ORDER — RIZATRIPTAN BENZOATE 10 MG PO TABS: ORAL_TABLET | ORAL | 1 refills | Status: DC

## 2021-04-20 NOTE — Progress Notes (Signed)
PATIENT: Nicole Bartlett DOB: Jun 23, 1966  REASON FOR VISIT: follow up HISTORY FROM: patient  HISTORY OF PRESENT ILLNESS: Today 04/20/21  Nicole Bartlett is a 55 year old female with history of headaches.  Doing well on Aimovig 140 mg monthly injection, denies side effect.  Is on Depakote from her psychiatrist.  Takes Maxalt with good benefit as needed.  Has OSA, but does not use BiPAP.  On average, reports 2 migraines a month, will start with Tylenol, if not helpful will proceed with Maxalt, usually only takes Maxalt once a month.  Had right Achilles tendon repair in December 2021.  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  Overall health has been good.  Here today for evaluation unaccompanied.  Is pleased with her headache control.  Update 04/16/2020 SS: Nicole Bartlett is a 55 year old female with history of headaches and OSA, does not use BiPAP.  She remains on Aimovig 140 mg monthly injection, she has come off Topamax.  Headaches remain well controlled.  She may have 2 migraines a month, does have fairly frequent " regular headache" easily taken care of with Tylenol.  Sees her psychiatrist monthly, is on Depakote.  Migraines respond well to Maxalt, usually takes twice a month with good benefit.  Denies side effects of medications.  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  She works as an Optometrist at [employer]. Having some right Achilles tendon issues.  Presents today for evaluation unaccompanied.  HISTORY  08/13/2019 SS: Nicole Bartlett is a 55 year old female with history of headaches and obstructive sleep apnea.  She does not use her BiPAP.  She remains on Topamax 25 mg in the morning, 50 mg in the evening. Higher doses have caused her to have difficulty focusing. She is also taking Depakote, prescribed by her psychiatrist.  She takes Maxalt as needed for headache. She had right shoulder replacement surgery in August 2020.  She is also taking Aimovig  prescribed by her psychiatrist for migraines.  She reports her headaches are doing well.  She received 3-4 Aimovig injections, but missed the injection last month.  She has remained on Topamax.  She says with Aimovig she has had an 80% reduction in headaches.  She has not had to take Maxalt in 2 months.  She reports her headaches are triggered by sun sensitivity, driving at night when the sun is going down.  She indicates her health has been good.  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  Her psychiatrist is at the [facility]. She does have tremor to bilateral hands and head titubation for several years. She reports family history of same.  She has a follow-up unaccompanied.  REVIEW OF SYSTEMS: Out of a complete 14 system review of symptoms, the patient complains only of the following symptoms, and all other reviewed systems are negative.  headache  ALLERGIES: Allergies  Allergen Reactions  . Sulfa Antibiotics Itching and Rash    angioedema   . Ibuprofen Other (See Comments)    Hx of stomach ulcers    HOME MEDICATIONS: Outpatient Medications Prior to Visit  Medication Sig Dispense Refill  . buPROPion (WELLBUTRIN XL) 300 MG 24 hr tablet Take 300 mg by mouth daily.    . divalproex (DEPAKOTE ER) 500 MG 24 hr tablet Take 500 mg by mouth daily.     Marland Kitchen doxepin (SINEQUAN) 50 MG capsule Take 50 mg  by mouth at bedtime.    . methocarbamol (ROBAXIN) 500 MG tablet Take 500 mg by mouth 4 (four) times daily.     . Oxycodone HCl 10 MG TABS Take 10 mg by mouth 3 (three) times daily.     . pantoprazole (PROTONIX) 40 MG tablet Take 40 mg by mouth daily as needed (heartburn).   5  . PENNSAID 2 % SOLN Apply 2 Pump topically 2 (two) times daily as needed (pain).   0  . REXULTI 2 MG TABS Take 2 mg by mouth daily.     Marland Kitchen XTAMPZA ER 18 MG C12A Take 1 capsule by mouth 2 (two) times daily.    Eduard Roux (AIMOVIG) 140 MG/ML SOAJ Inject 140 mg  into the skin every 28 (twenty-eight) days. 1 pen 11  . rizatriptan (MAXALT) 10 MG tablet TAKE 1 TAB BY MOUTH AS NEEDED FOR MIGRAINE. MAY REPEAT IN 2 HOURS IF NEEDED, MAX 2/DAY 30 tablet 1  . doxycycline (VIBRA-TABS) 100 MG tablet Take 1 tablet (100 mg total) by mouth 2 (two) times daily. 20 tablet 0  . meloxicam (MOBIC) 15 MG tablet TAKE 1 TABLET BY MOUTH EVERY DAY 30 tablet 0  . methylPREDNISolone (MEDROL) 4 MG tablet Take as directed 21 tablet 0  . oxyCODONE-acetaminophen (PERCOCET) 5-325 MG tablet Take 1-2 tablets by mouth every 4 (four) hours as needed for severe pain. 30 tablet 0   No facility-administered medications prior to visit.    PAST MEDICAL HISTORY: Past Medical History:  Diagnosis Date  . Anxiety and depression   . Chronic neck pain   . Fibromyalgia   . GERD (gastroesophageal reflux disease)   . Headache    migraines  . Narcotic dependence ([place])   . Sleep apnea   . Tremor     PAST SURGICAL HISTORY: Past Surgical History:  Procedure Laterality Date  . C5/C6 fusion  2010  . C5/C7 fusion  2002  . CESAREAN SECTION     times 2  . GALLBLADDER SURGERY    . REVERSE SHOULDER ARTHROPLASTY Right 06/28/2019   Procedure: REVERSE SHOULDER ARTHROPLASTY;  Surgeon: Netta Cedars, MD;  Location: [facility];  Service: Orthopedics;  Laterality: Right;  with interscalene block  . ROTATOR CUFF REPAIR Bilateral     FAMILY HISTORY: Family History  Problem Relation Age of Onset  . Cancer Mother   . Dementia Mother   . Diabetes Father   . Migraines Sister   . Fibroids Daughter     SOCIAL HISTORY: Social History   Socioeconomic History  . Marital status: Legally Separated    Spouse name: Not on file  . Number of children: 2  . Years of education: Not on file  . Highest education level: Not on file  Occupational History  . Occupation: Manufacturing engineer: [employer]  Tobacco Use  . Smoking status: Current Every Day Smoker    Packs/day: 0.25    Years: 15.00    Pack years:  3.75    Types: Cigarettes  . Smokeless tobacco: Never Used  Vaping Use  . Vaping Use: Every day  . Start date: 06/10/2019  . Substances: Nicotine, CBD, Flavoring  Substance and Sexual Activity  . Alcohol use: Yes    Alcohol/week: 0.0 standard drinks    Comment: rare  . Drug use: No  . Sexual activity: Not on file  Other Topics Concern  . Not on file  Social History Narrative   Patient is separated.   Patient has 2  children   Patient lives at home with daughter.   Patient drinks caffeine everyday until 5pm.         Social Determinants of Health   Financial Resource Strain: Not on file  Food Insecurity: Not on file  Transportation Needs: Not on file  Physical Activity: Not on file  Stress: Not on file  Social Connections: Not on file  Intimate Partner Violence: Not on file   PHYSICAL EXAM  Vitals:   04/20/21 0748  BP: 124/80  Pulse: 90  Weight: 226 lb (102.5 kg)  Height: 5\' 4"  (1.626 m)   Body mass index is 38.79 kg/m.  Generalized: Well developed, in no acute distress   Neurological examination  Mentation: Alert oriented to time, place, history taking. Follows all commands speech and language fluent Cranial nerve II-XII: Pupils were equal round reactive to light. Extraocular movements were full, visual field were full on confrontational test. Facial sensation and strength were normal. Head turning and shoulder shrug  were normal and symmetric. Motor: The motor testing reveals 5 over 5 strength of all 4 extremities. Good symmetric motor tone is noted throughout.  Sensory: Sensory testing is intact to soft touch on all 4 extremities. No evidence of extinction is noted.  Coordination: Cerebellar testing reveals good finger-nose-finger and heel-to-shin bilaterally.  Gait and station: Gait is normal.  Reflexes: Deep tendon reflexes are symmetric and normal bilaterally.   DIAGNOSTIC DATA (LABS, IMAGING, TESTING) - I reviewed patient records, labs, notes, testing and  imaging myself where available.  Lab Results  Component Value Date   WBC 5.7 06/26/2019   HGB 10.4 (L) 06/29/2019   HCT 34.0 (L) 06/29/2019   MCV 95.7 06/26/2019   PLT 233 06/26/2019      Component Value Date/Time   NA 135 06/29/2019 0305   K 4.2 06/29/2019 0305   CL 104 06/29/2019 0305   CO2 22 06/29/2019 0305   GLUCOSE 153 (H) 06/29/2019 0305   BUN 10 06/29/2019 0305   CREATININE 0.66 06/29/2019 0305   CALCIUM 8.2 (L) 06/29/2019 0305   GFRNONAA >60 06/29/2019 0305   GFRAA >60 06/29/2019 0305   No results found for: CHOL, HDL, LDLCALC, LDLDIRECT, TRIG, CHOLHDL No results found for: HGBA1C No results found for: VITAMINB12 No results found for: TSH  ASSESSMENT AND PLAN 55 y.o. year old female  has a past medical history of Anxiety and depression, Chronic neck pain, Fibromyalgia, GERD (gastroesophageal reflux disease), Headache, Narcotic dependence ([place]), Sleep apnea, and Tremor. here with:  1.  Chronic migraine headache  -On average 2 a month, well controlled -Continue Aimovig 140 mg monthly injection for migraine prevention, excellent benefit, denies adverse side effect -Continue Maxalt as needed for acute headache -On Depakote from psychiatry -Follow-up in 1 year or sooner if needed  Evangeline Dakin, DNP 04/20/2021, 8:09 AM [facility] [address] [place], [place] [address] [phone]

## 2021-04-20 NOTE — Patient Instructions (Signed)
Continue current medications  Call for any worsening headaches  See you back in 1 year

## 2021-04-20 NOTE — Progress Notes (Signed)
I have read the note, and I agree with the clinical assessment and plan.  Deneice Wack K Georgie Haque   

## 2021-05-31 IMAGING — MR MR ANKLE*R* W/O CM
4 of 5 series · 17 of 40 positions shown · non-contrast
Comparison: None.

CLINICAL DATA: Chronic posterior ankle pain with recent episode of
severe pain and bruising.

EXAM:
MRI OF THE RIGHT ANKLE WITHOUT CONTRAST
TECHNIQUE: Multiplanar, multisequence MR imaging of the ankle was performed. No
intravenous contrast was administered.

[Series 3: T1 · axial · right · 3.0mm · 0.31mm/px · z∈[-34,+99]mm · 3 of 44 slices shown (1 of 2)]
[im 5/44]
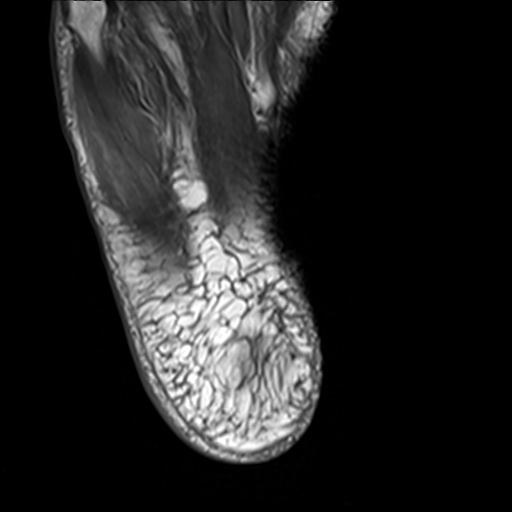
[im 22/44]
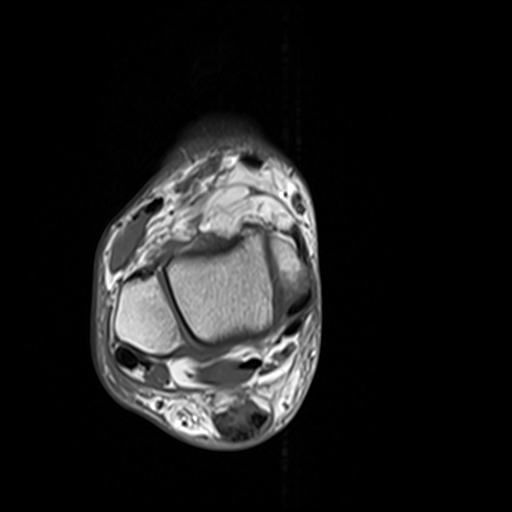
[im 39/44]
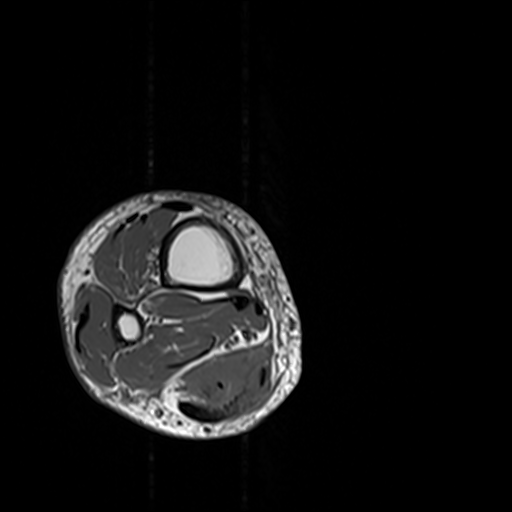

[Series 4: T2 fat-sat · axial · right · 3.0mm · 0.31mm/px · z∈[-49,+118]mm · 8 of 44 slices shown (1 of 2)]
[im 1/44]
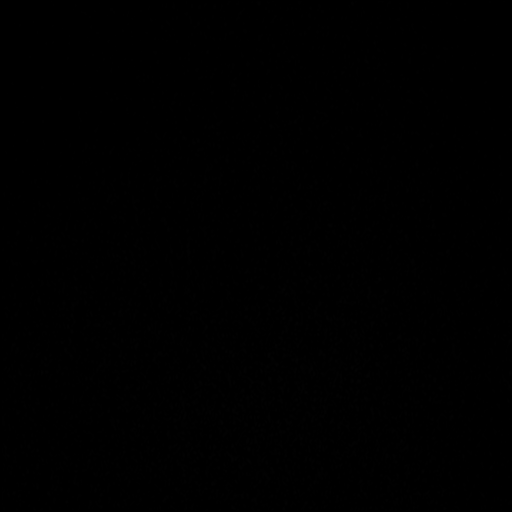
[im 5/44]
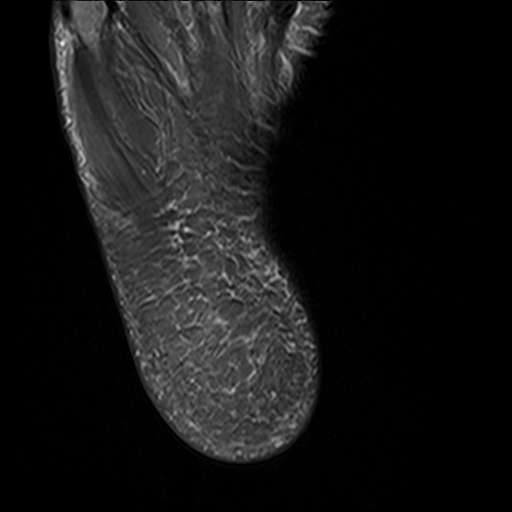
[im 15/44]
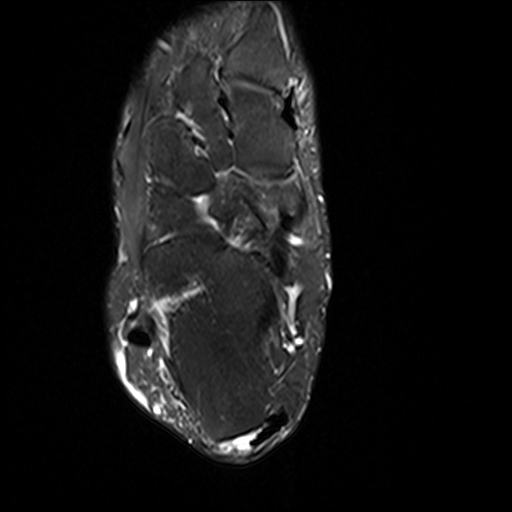
[im 20/44]
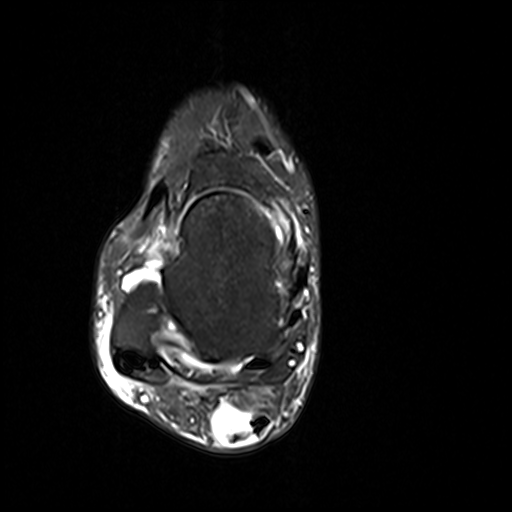
[im 24/44]
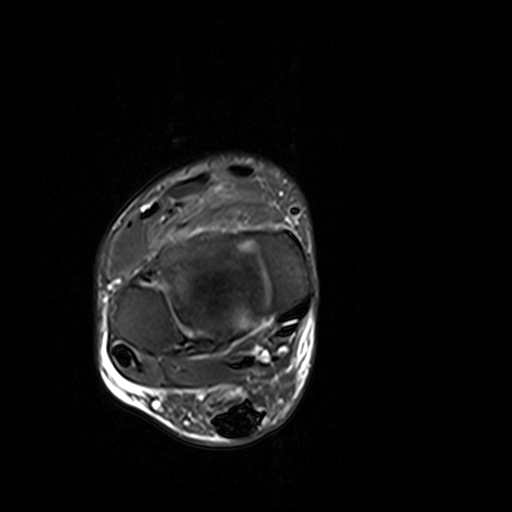
[im 29/44]
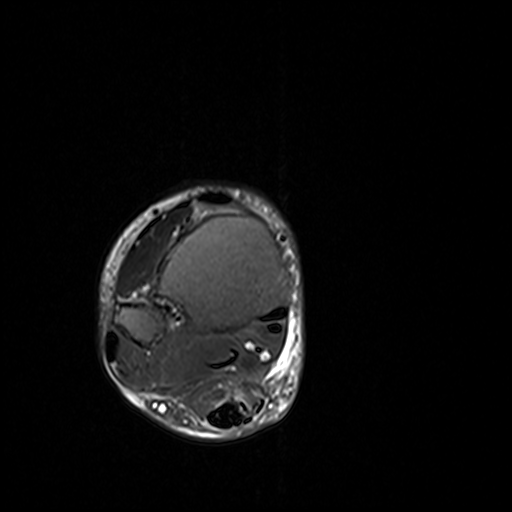
[im 39/44]
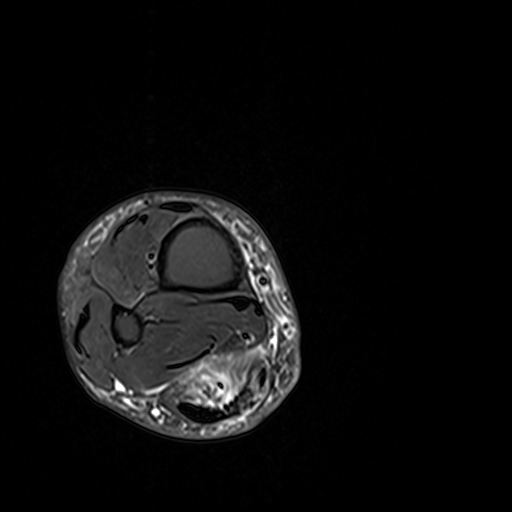
[im 44/44]
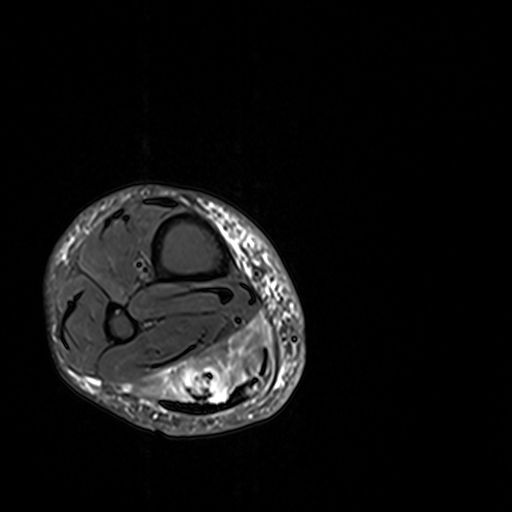

[Series 5: T1 · sagittal · right · 4.0mm · 0.27mm/px · 3 of 21 slices shown (2 of 2)]
[im 1/21]
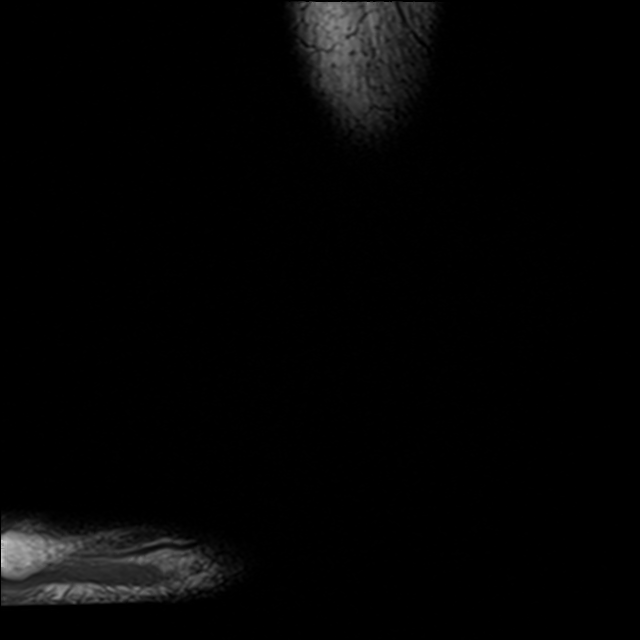
[im 11/21]
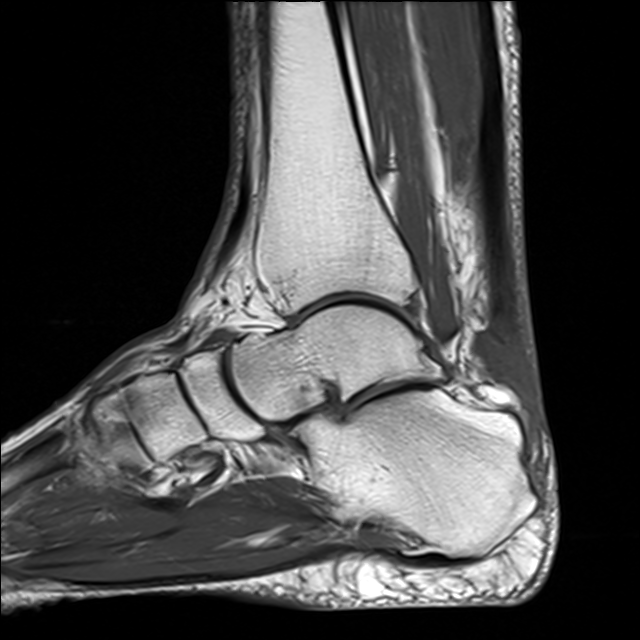
[im 21/21]
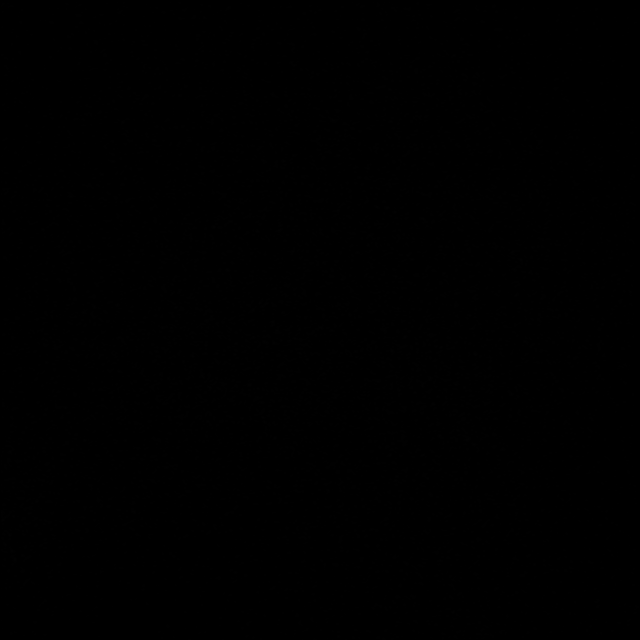

[Series 7: T2 fat-sat · coronal · right · 3.0mm · 0.31mm/px · 3 of 40 slices shown (2 of 2)]
[im 5/40]
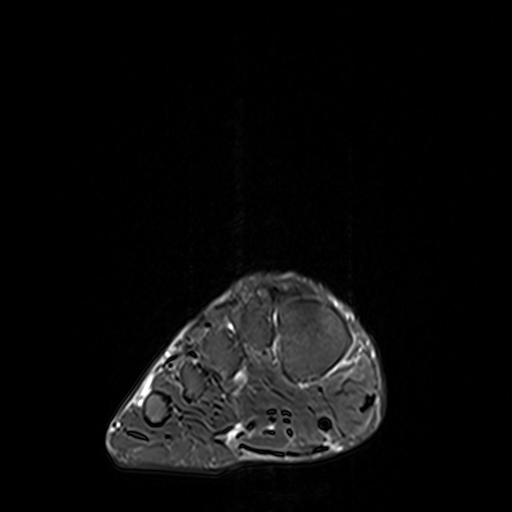
[im 20/40]
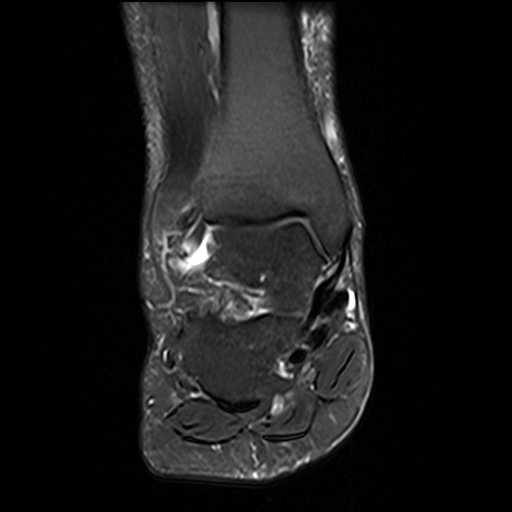
[im 35/40]
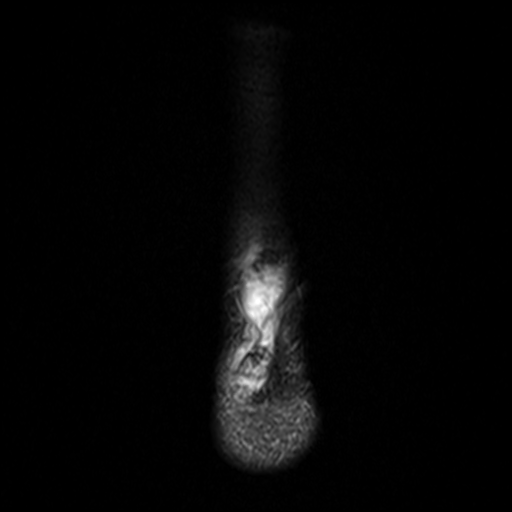

[17 of 40 positions shown; findings below may reference images not displayed]

FINDINGS: TENDONS

Peroneal: Intact. Short segment longitudinal split type tear
involving the peroneus brevis tendon proximally. Mild tenosynovitis.

Posteromedial: Intact. Moderate tenosynovitis involving the
posterior tibialis tendon.

Anterior: Intact

Achilles: High-grade partial-thickness tear. The lateral half of the
tendon is completely torn and retracted approximately 2.5 cm. The
medial fibers are still intact to the Achilles attachment site on
the calcaneus.

The surrounding fluid/inflammation/edema and hemorrhage extending up
into the gastroc muscles.

Plantar Fascia: Intact. No findings for plantar fasciitis. Small
calcaneal heel spur.

LIGAMENTS

Lateral: The anterior and posterior tibiofibular ligaments are
intact. The anterior talofibular ligament appears chronically torn.
The posterior talofibular ligament is intact.

Medial: Intact

CARTILAGE

Ankle Joint: The ankle joint is maintained. Mild degenerative
chondrosis but no full-thickness cartilage defects or osteochondral
lesion. No joint effusion.

Subtalar Joints/Sinus Tarsi: The subtalar joints are maintained.
Sinus tarsi is unremarkable. The cervical and interosseous ligaments
are intact. The spring ligament is intact.

Bones: No acute bony findings. No bone contusion, marrow edema or
fracture.

Other: Unremarkable foot and ankle musculature.
IMPRESSION: 1. High-grade partial-thickness retracted tear involving the lateral
half of the Achilles tendon. The medial fibers are still intact.
2. Short segment longitudinal split type tear involving the peroneus
brevis tendon proximally. Mild tenosynovitis.
3. Moderate tenosynovitis involving the posterior tibialis tendon.
4. Chronically torn anterior talofibular ligament.
5. No acute bony findings.

## 2021-06-02 ENCOUNTER — Telehealth: Payer: Self-pay | Admitting: *Deleted

## 2021-06-02 NOTE — Telephone Encounter (Signed)
PA for Aimovig 140mg  started on covermymeds (key: [id number]). Pt has pharmacy coverage through [facility] [phone]). Decision pending.

## 2021-06-04 NOTE — Telephone Encounter (Signed)
PA approved through 06/01/2022.

## 2021-07-17 ENCOUNTER — Other Ambulatory Visit: Payer: Self-pay | Admitting: Neurology

## 2021-09-01 ENCOUNTER — Other Ambulatory Visit: Payer: Self-pay

## 2021-09-01 ENCOUNTER — Ambulatory Visit: Payer: BC Managed Care – PPO

## 2021-09-01 DIAGNOSIS — Z23 Encounter for immunization: Secondary | ICD-10-CM

## 2021-12-09 ENCOUNTER — Other Ambulatory Visit: Payer: Self-pay

## 2021-12-09 ENCOUNTER — Ambulatory Visit: Payer: BC Managed Care – PPO | Admitting: Dermatology

## 2021-12-09 DIAGNOSIS — L570 Actinic keratosis: Secondary | ICD-10-CM | POA: Diagnosis not present

## 2021-12-09 MED ORDER — FLUOROURACIL 5 % EX CREA: TOPICAL_CREAM | Freq: Two times a day (BID) | CUTANEOUS | 0 refills | Status: DC

## 2021-12-09 NOTE — Progress Notes (Signed)
° °  New Patient Visit  Subjective  Nicole Bartlett is a 56 y.o. female who presents for the following: Skin Problem (Patient here today for a spot that came up on her lip about 6 months ago but has started to get better. Patient advises she has had what she thinks was a AK at the lip treated years ago with LN2. No hx of skin cancer. ).  No fhx of skin cancer.   The following portions of the chart were reviewed this encounter and updated as appropriate:   Tobacco   Allergies   Meds   Problems   Med Hx   Surg Hx   Fam Hx       Review of Systems:  No other skin or systemic complaints except as noted in HPI or Assessment and Plan.  Objective  Well appearing patient in no apparent distress; mood and affect are within normal limits.  A focused examination was performed including face, hands. Relevant physical exam findings are noted in the Assessment and Plan.  upper lip, right hand Erythematous thin papules/macules with gritty scale.     Assessment & Plan  AK (actinic keratosis) upper lip, right hand  Actinic keratoses are precancerous spots that appear secondary to cumulative UV radiation exposure/sun exposure over time. They are chronic with expected duration over 1 year. A portion of actinic keratoses will progress to squamous cell carcinoma of the skin. It is not possible to reliably predict which spots will progress to skin cancer and so treatment is recommended to prevent development of skin cancer.  Recommend daily broad spectrum sunscreen SPF 30+ to sun-exposed areas, reapply every 2 hours as needed.  Recommend staying in the shade or wearing long sleeves, sun glasses (UVA+UVB protection) and wide brim hats (4-inch brim around the entire circumference of the hat). Call for new or changing lesions.  Discussed LN2 vs topical therapy  Start 5-fluorouracil/calcipotriene cream twice a day for 4 days to affected areas including upper lip. Prescription sent to [facility].  Patient provided with contact information for pharmacy and advised the pharmacy will mail the prescription to their home. Patient provided with handout reviewing treatment course and side effects and advised to call or message [place] on MyChart with any concerns.  AK > SK at right hand   fluorouracil (EFUDEX) 5 % cream - upper lip, right hand Apply topically 2 (two) times daily. Apply twice daily for 4 days as directed   Return in about 6 weeks (around 01/20/2022) for AK follow up, TBSE.  Graciella Belton, RMA, am acting as scribe for Forest Gleason, MD .  Documentation: I have reviewed the above documentation for accuracy and completeness, and I agree with the above.  Forest Gleason, MD

## 2021-12-09 NOTE — Patient Instructions (Addendum)
Start 5-fluorouracil/calcipotriene cream twice a day for 4 days to affected areas including upper lip and for 7 days to right hand. Prescription sent to [facility]. Patient provided with contact information for pharmacy and advised the pharmacy will mail the prescription to their home. Patient provided with handout reviewing treatment course and side effects and advised to call or message [place] on MyChart with any concerns.   5-Fluorouracil/Calcipotriene Patient Education   Actinic keratoses are the dry, red scaly spots on the skin caused by sun damage. A portion of these spots can turn into skin cancer with time, and treating them can help prevent development of skin cancer.   Treatment of these spots requires removal of the defective skin cells. There are various ways to remove actinic keratoses, including freezing with liquid nitrogen, treatment with creams, or treatment with a blue light procedure in the office.   5-fluorouracil cream is a topical cream used to treat actinic keratoses. It works by interfering with the growth of abnormal fast-growing skin cells, such as actinic keratoses. These cells peel off and are replaced by healthy ones.   5-fluorouracil/calcipotriene is a combination of the 5-fluorouracil cream with a vitamin D analog cream called calcipotriene. The calcipotriene alone does not treat actinic keratoses. However, when it is combined with 5-fluorouracil, it helps the 5-fluorouracil treat the actinic keratoses much faster so that the same results can be achieved with a much shorter treatment time.  INSTRUCTIONS FOR 5-FLUOROURACIL/CALCIPOTRIENE CREAM:   5-fluorouracil/calcipotriene cream typically only needs to be used for 4-7 days. A thin layer should be applied twice a day to the treatment areas recommended by your physician.   If your physician prescribed you separate tubes of 5-fluourouracil and calcipotriene, apply a thin layer of 5-fluorouracil followed by a thin layer  of calcipotriene.   Avoid contact with your eyes, nostrils, and mouth. Do not use 5-fluorouracil/calcipotriene cream on infected or open wounds.   You will develop redness, irritation and some crusting at areas where you have pre-cancer damage/actinic keratoses. IF YOU DEVELOP PAIN, BLEEDING, OR SIGNIFICANT CRUSTING, STOP THE TREATMENT EARLY - you have already gotten a good response and the actinic keratoses should clear up well.  Wash your hands after applying 5-fluorouracil 5% cream on your skin.   A moisturizer or sunscreen with a minimum SPF 30 should be applied each morning.   Once you have finished the treatment, you can apply a thin layer of Vaseline twice a day to irritated areas to soothe and calm the areas more quickly. If you experience significant discomfort, contact your physician.  For some patients it is necessary to repeat the treatment for best results.  SIDE EFFECTS: When using 5-fluorouracil/calcipotriene cream, you may have mild irritation, such as redness, dryness, swelling, or a mild burning sensation. This usually resolves within 2 weeks. The more actinic keratoses you have, the more redness and inflammation you can expect during treatment. Eye irritation has been reported rarely. If this occurs, please let us know.  If you have any trouble using this cream, please call the office. If you have any other questions about this information, please do not hesitate to ask me before you leave the office.  If You Need Anything After Your Visit  If you have any questions or concerns for your doctor, please call our main line at [phone] and press option 4 to reach your doctor's medical assistant. If no one answers, please leave a voicemail as directed and we will return your call as soon as  possible. Messages left after 4 pm will be answered the following business day.   You may also send [place] a message via [place]. We typically respond to MyChart messages within 1-2 business  days.  For prescription refills, please ask your pharmacy to contact our office. Our fax number is [phone].  If you have an urgent issue when the clinic is closed that cannot wait until the next business day, you can page your doctor at the number below.    Please note that while we do our best to be available for urgent issues outside of office hours, we are not available 24/7.   If you have an urgent issue and are unable to reach [place], you may choose to seek medical care at your doctor's office, retail clinic, urgent care center, or emergency room.  If you have a medical emergency, please immediately call 911 or go to the emergency department.  Pager Numbers  - Dr. Nehemiah Massed: [phone]  - Dr. Laurence Ferrari: [phone]  - Dr. Nicole Kindred: [phone]  In the event of inclement weather, please call our main line at [phone] for an update on the status of any delays or closures.  Dermatology Medication Tips: Please keep the boxes that topical medications come in in order to help keep track of the instructions about where and how to use these. Pharmacies typically print the medication instructions only on the boxes and not directly on the medication tubes.   If your medication is too expensive, please contact our office at [phone] option 4 or send [place] a message through [place].   We are unable to tell what your co-pay for medications will be in advance as this is different depending on your insurance coverage. However, we may be able to find a substitute medication at lower cost or fill out paperwork to get insurance to cover a needed medication.   If a prior authorization is required to get your medication covered by your insurance company, please allow [place] 1-2 business days to complete this process.  Drug prices often vary depending on where the prescription is filled and some pharmacies may offer cheaper prices.  The website www.goodrx.com contains coupons for medications through  different pharmacies. The prices here do not account for what the cost may be with help from insurance (it may be cheaper with your insurance), but the website can give you the price if you did not use any insurance.  - You can print the associated coupon and take it with your prescription to the pharmacy.  - You may also stop by our office during regular business hours and pick up a GoodRx coupon card.  - If you need your prescription sent electronically to a different pharmacy, notify our office through [facility] or by phone at [phone] option 4.     Si Usted Necesita Algo Despus de Su Visita  Tambin puede enviarnos un mensaje a travs de Pharmacist, community. Por lo general respondemos a los mensajes de MyChart en el transcurso de 1 a 2 das hbiles.  Para renovar recetas, por favor pida a su farmacia que se ponga en contacto con nuestra oficina. Harland Dingwall de fax es [place] [phone].  Si tiene un asunto urgente cuando la clnica est cerrada y que no puede esperar hasta el siguiente da hbil, puede llamar/localizar a su doctor(a) al nmero que aparece a continuacin.   Por favor, tenga en cuenta que aunque hacemos todo lo posible para estar disponibles para asuntos urgentes fuera del horario de [place],  no estamos disponibles las 24 horas del da, los 7 das de la [place].   Si tiene un problema urgente y no puede comunicarse con nosotros, puede optar por buscar atencin mdica  en el consultorio de su doctor(a), en una clnica privada, en un centro de atencin urgente o en una sala de emergencias.  Si tiene Engineering geologist, por favor llame inmediatamente al 911 o vaya a la sala de emergencias.  Nmeros de bper  - Dr. Nehemiah Massed: [phone]  - Dra. Moye: [phone]  - Dra. Nicole Kindred: [phone]  En caso de inclemencias del [place], por favor llame a Johnsie Kindred principal al [phone] para una actualizacin sobre el [place] de cualquier retraso o cierre.  Consejos  para la medicacin en dermatologa: Por favor, guarde las cajas en las que vienen los medicamentos de uso tpico para ayudarle a seguir las instrucciones sobre dnde y cmo usarlos. Las farmacias generalmente imprimen las instrucciones del medicamento slo en las cajas y no directamente en los tubos del [place].   Si su medicamento es muy caro, por favor, pngase en contacto con Zigmund Daniel llamando al [phone] y presione la opcin 4 o envenos un mensaje a travs de Pharmacist, community.   No podemos decirle cul ser su copago por los medicamentos por adelantado ya que esto es diferente dependiendo de la cobertura de su seguro. Sin embargo, es posible que podamos encontrar un medicamento sustituto a Electrical engineer un formulario para que el seguro cubra el medicamento que se considera necesario.   Si se requiere una autorizacin previa para que su compaa de seguros [place] su medicamento, por favor permtanos de 1 a 2 das hbiles para completar este proceso.  Los precios de los medicamentos varan con frecuencia dependiendo del Environmental consultant de dnde se surte la receta y alguna farmacias pueden ofrecer precios ms baratos.  El sitio web www.goodrx.com tiene cupones para medicamentos de Airline pilot. Los precios aqu no tienen en cuenta lo que podra costar con la ayuda del seguro (puede ser ms barato con su seguro), pero el sitio web puede darle el precio si no utiliz Research scientist (physical sciences).  - Puede imprimir el cupn correspondiente y llevarlo con su receta a la farmacia.  - Tambin puede pasar por nuestra oficina durante el horario de atencin regular y Charity fundraiser una tarjeta de cupones de GoodRx.  - Si necesita que su receta se enve electrnicamente a una farmacia diferente, informe a nuestra oficina a travs de MyChart de [place] o por telfono llamando al [phone] y presione la opcin 4.  Recommend taking Heliocare sun protection supplement daily in sunny weather for additional sun  protection. For maximum protection on the sunniest days, you can take up to 2 capsules of regular Heliocare OR take 1 capsule of Heliocare Ultra. For prolonged exposure (such as a full day in the sun), you can repeat your dose of the supplement 4 hours after your first dose. Heliocare can be purchased at [facility] or at [email or web address].

## 2021-12-20 ENCOUNTER — Encounter: Payer: Self-pay | Admitting: Dermatology

## 2021-12-26 ENCOUNTER — Emergency Department (INDEPENDENT_AMBULATORY_CARE_PROVIDER_SITE_OTHER): Payer: BC Managed Care – PPO

## 2021-12-26 ENCOUNTER — Encounter: Payer: Self-pay | Admitting: Emergency Medicine

## 2021-12-26 ENCOUNTER — Emergency Department
Admission: EM | Admit: 2021-12-26 | Discharge: 2021-12-26 | Disposition: A | Discharge status: Home / Self Care | Payer: BC Managed Care – PPO | Source: Home / Self Care | Attending: Family Medicine | Admitting: Family Medicine

## 2021-12-26 DIAGNOSIS — M79642 Pain in left hand: Secondary | ICD-10-CM | POA: Diagnosis not present

## 2021-12-26 DIAGNOSIS — W19XXXA Unspecified fall, initial encounter: Secondary | ICD-10-CM

## 2021-12-26 DIAGNOSIS — S62102A Fracture of unspecified carpal bone, left wrist, initial encounter for closed fracture: Secondary | ICD-10-CM | POA: Diagnosis not present

## 2021-12-26 DIAGNOSIS — W101XXA Fall (on)(from) sidewalk curb, initial encounter: Secondary | ICD-10-CM

## 2021-12-26 NOTE — ED Provider Notes (Signed)
[facility]    CSN: [id number] Arrival date & time: 12/26/21  0954      History   Chief Complaint Chief Complaint  Patient presents with   Wrist Pain    left    HPI Nicole Bartlett is a 56 y.o. female.   HPI  Patient states that she tripped over a parking curb and landed on her outstretched hands.  Left hand and wrist is painful and swollen today.  No history of osteoporosis or fracture.  Patient is a chronic pain patient with fibromyalgia, migraines, cigarette smoking and obesity  Past Medical History:  Diagnosis Date   Anxiety and depression    Chronic neck pain    Fibromyalgia    GERD (gastroesophageal reflux disease)    Headache    migraines   Narcotic dependence ([place])    Sleep apnea    Tremor     Patient Active Problem List   Diagnosis Date Noted   Low grade squamous intraepithelial lesion (LGSIL) on cervicovaginal cytologic smear 04/28/2020   Postmenopausal bleeding 04/28/2020   Pain in left knee 07/25/2019   Depressive disorder 07/10/2019   S/P shoulder replacement, 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   Noncompliance with CPAP treatment 01/15/2019   Synovitis 12/25/2018   Pain of left hand 11/06/2018   Trigger finger 09/26/2018   Carpal tunnel syndrome 08/29/2018   GERD (gastroesophageal reflux disease) 11/22/2017   Drug-induced constipation 07/05/2017   Nasal septal perforation 02/26/2017   OSA (obstructive sleep apnea) 01/16/2017   Prediabetes 07/15/2016   Dyslipidemia 06/21/2016   High risk medication use 06/21/2016   Vitamin D deficiency 06/21/2016   Migraine 12/29/2015   Fatigue 02/06/2015   Difficulty staying awake 02/06/2015   Adiposity 02/06/2015   Snores 02/06/2015   Current tobacco use 02/06/2015   Chronic pain associated with significant psychosocial dysfunction 09/22/2014   Depression, major, recurrent ([place]) 09/22/2014   Intractable migraine without aura 03/06/2014   Headache 03/06/2014   Absence of menstruation 08/15/2012    Past  Surgical History:  Procedure Laterality Date   C5/C6 fusion  2010   C5/C7 fusion  2002   CESAREAN SECTION     times 2   GALLBLADDER SURGERY     REVERSE SHOULDER ARTHROPLASTY Right 06/28/2019   Procedure: REVERSE SHOULDER ARTHROPLASTY;  Surgeon: Netta Cedars, MD;  Location: [facility];  Service: Orthopedics;  Laterality: Right;  with interscalene block   ROTATOR CUFF REPAIR Bilateral     OB History   No obstetric history on file.      Home Medications    Prior to Admission medications   Medication Sig Start Date End Date Taking? Authorizing Provider  buPROPion (WELLBUTRIN XL) 300 MG 24 hr tablet Take 300 mg by mouth daily.    [provider]  busPIRone (BUSPAR) 10 MG tablet Take 10 mg by mouth 3 (three) times daily. 12/17/21   [provider]  divalproex (DEPAKOTE ER) 500 MG 24 hr tablet Take 500 mg by mouth daily.  01/14/19   [provider]  doxepin (SINEQUAN) 50 MG capsule Take 50 mg by mouth at bedtime.    [provider]  Erenumab-aooe (AIMOVIG) 140 MG/ML SOAJ Inject 140 mg into the skin every 28 (twenty-eight) days. 04/20/21   Suzzanne Cloud, NP  fluorouracil (EFUDEX) 5 % cream Apply topically 2 (two) times daily. Apply twice daily for 4 days as directed 12/09/21   Moye, [place], MD  methocarbamol (ROBAXIN) 500 MG tablet Take 500 mg by mouth 4 (four) times  daily.     [provider]  Oxycodone HCl 10 MG TABS Take 10 mg by mouth 3 (three) times daily.     [provider]  pantoprazole (PROTONIX) 40 MG tablet Take 40 mg by mouth daily as needed (heartburn).  02/20/15   [provider]  propranolol (INDERAL) 10 MG tablet SMARTSIG:0.5-1 Tablet(s) By Mouth 1-3 Times Daily PRN 12/16/21   [provider]  REXULTI 2 MG TABS Take 2 mg by mouth daily.  11/22/18   [provider]  rizatriptan (MAXALT) 10 MG tablet MAY REPEAT IN 2 HOURS IF NEEDED 07/19/21   Kathrynn Ducking, MD  XTAMPZA ER 18 MG C12A Take 1 capsule by  mouth 2 (two) times daily. 04/25/20   [provider]    Family History Family History  Problem Relation Age of Onset   Cancer Mother    Dementia Mother    Diabetes Father    Migraines Sister    Fibroids Daughter     Social History Social History   Tobacco Use   Smoking status: Every Day    Packs/day: 0.25    Years: 15.00    Pack years: 3.75    Types: Cigarettes    Passive exposure: Never   Smokeless tobacco: Never  Vaping Use   Vaping Use: Every day   Start date: 06/10/2019   Substances: Nicotine, CBD, Flavoring  Substance Use Topics   Alcohol use: Yes    Alcohol/week: 0.0 standard drinks    Comment: rare   Drug use: No     Allergies   Sulfa antibiotics and Ibuprofen   Review of Systems Review of Systems See HPI  Physical Exam Triage Vital Signs ED Triage Vitals  Enc Vitals Group     BP 12/26/21 1032 120/83     Pulse Rate 12/26/21 1032 95     Resp 12/26/21 1032 18     Temp 12/26/21 1032 98.9 F (37.2 C)     Temp Source 12/26/21 1032 Oral     SpO2 12/26/21 1032 97 %     Weight 12/26/21 1034 237 lb (107.5 kg)     Height 12/26/21 1034 5\' 4"  (1.626 m)     Head Circumference --      Peak Flow --      Pain Score 12/26/21 1033 3     Pain Loc --      Pain Edu? --      Excl. in [place]? --    No data found.  Updated Vital Signs BP 120/83 (BP Location: Right Arm)    Pulse 95    Temp 98.9 F (37.2 C) (Oral)    Resp 18    Ht 5\' 4"  (1.626 m)    Wt 107.5 kg    SpO2 97%    BMI 40.68 kg/m      Physical Exam Constitutional:      General: She is not in acute distress.    Appearance: She is well-developed. She is obese.  HENT:     Head: Normocephalic and atraumatic.  Eyes:     Conjunctiva/sclera: Conjunctivae normal.     Pupils: Pupils are equal, round, and reactive to light.  Cardiovascular:     Rate and Rhythm: Normal rate.  Pulmonary:     Effort: Pulmonary effort is normal. No respiratory distress.  Abdominal:     General: There is no  distension.     Palpations: Abdomen is soft.  Musculoskeletal:  General: Normal range of motion.     Cervical back: Normal range of motion.     Comments: There is mild soft tissue swelling about the left wrist.  Tenderness is directly over the radial styloid.  No tenderness of the anatomic snuffbox.  No tenderness over the metacarpals or hand.  Limited range of motion secondary to pain.  Sensory exam is normal  Skin:    General: Skin is warm and dry.  Neurological:     Mental Status: She is alert.  Psychiatric:        Mood and Affect: Mood normal.        Behavior: Behavior normal.     UC Treatments / Results  Labs (all labs ordered are listed, but only abnormal results are displayed) Labs Reviewed - No data to display  EKG   Radiology DG Hand Complete Left  Result Date: 12/26/2021 CLINICAL DATA:  Status post fall.  Left hand and wrist pain. EXAM: LEFT HAND - COMPLETE 3+ VIEW COMPARISON:  [id number]. FINDINGS: There is some cortical irregularity along the radial styloid raising concern for nondisplaced fracture. No other acute fracture evident. Interval resection of the trapezium. Chronic ulnar styloid fracture noted. IMPRESSION: 1. Cortical irregularity along the radial styloid, suspicious for nondisplaced fracture. Dedicated left wrist films may prove helpful. 2. Interval resection of the trapezium. Electronically Signed   By: Misty Stanley M.D.   On: 12/26/2021 11:15    Procedures Procedures (including critical care time)  Medications Ordered in UC Medications - No data to display  Initial Impression / Assessment and Plan / UC Course  I have reviewed the triage vital signs and the nursing notes.  Pertinent labs & imaging results that were available during my care of the patient were reviewed by me and considered in my medical decision making (see chart for details).     The area of irregularity on x-ray does correspond with the area of tenderness and swelling.  I feel she  has aNondisplaced fracture.  I will have her follow-up with her usual orthopedic, Dr. Amedeo Plenty, next week Final Clinical Impressions(s) / UC Diagnoses   Final diagnoses:  Closed fracture of left wrist, initial encounter  Fall, initial encounter     Discharge Instructions      Your x-ray is suspicious for fracture Wear brace at all times, may remove gently for washing or lotion Use ice to reduce pain and swelling See Dr. Amedeo Plenty infollow-up next week     ED Prescriptions   None    PDMP not reviewed this encounter.   Raylene Everts, MD 12/26/21 [phone]

## 2021-12-26 NOTE — ED Triage Notes (Signed)
Tripped over cement parking block at 4pm yesterday  Landed on left hand and wrist  Pain to left wrist  Swelling to left hand & wrist

## 2021-12-26 NOTE — Discharge Instructions (Addendum)
Your x-ray is suspicious for fracture Wear brace at all times, may remove gently for washing or lotion Use ice to reduce pain and swelling See Dr. Amedeo Plenty infollow-up next week

## 2022-01-18 ENCOUNTER — Ambulatory Visit: Payer: BC Managed Care – PPO | Admitting: Dermatology

## 2022-01-18 ENCOUNTER — Other Ambulatory Visit: Payer: Self-pay

## 2022-01-18 ENCOUNTER — Encounter: Payer: Self-pay | Admitting: Dermatology

## 2022-01-18 DIAGNOSIS — L814 Other melanin hyperpigmentation: Secondary | ICD-10-CM | POA: Diagnosis not present

## 2022-01-18 DIAGNOSIS — D1800 Hemangioma unspecified site: Secondary | ICD-10-CM

## 2022-01-18 DIAGNOSIS — L578 Other skin changes due to chronic exposure to nonionizing radiation: Secondary | ICD-10-CM

## 2022-01-18 DIAGNOSIS — D229 Melanocytic nevi, unspecified: Secondary | ICD-10-CM

## 2022-01-18 DIAGNOSIS — Z1283 Encounter for screening for malignant neoplasm of skin: Secondary | ICD-10-CM

## 2022-01-18 DIAGNOSIS — L570 Actinic keratosis: Secondary | ICD-10-CM | POA: Diagnosis not present

## 2022-01-18 DIAGNOSIS — L304 Erythema intertrigo: Secondary | ICD-10-CM

## 2022-01-18 DIAGNOSIS — L821 Other seborrheic keratosis: Secondary | ICD-10-CM

## 2022-01-18 MED ORDER — KETOCONAZOLE 2 % EX CREA: TOPICAL_CREAM | CUTANEOUS | 2 refills | Status: DC

## 2022-01-18 MED ORDER — HYDROCORTISONE 2.5 % EX CREA: TOPICAL_CREAM | CUTANEOUS | 2 refills | Status: DC

## 2022-01-18 NOTE — Patient Instructions (Addendum)
Cryotherapy Aftercare  Wash gently with soap and water everyday.   Apply Vaseline and Band-Aid daily until healed.   Prior to procedure, discussed risks of blister formation, small wound, skin dyspigmentation, or rare scar following cryotherapy. Recommend Vaseline ointment to treated areas while healing.   Intertrigo at breast folds:  Start hydrocortisone 2.5% cream: Apply twice daily up to 2 weeks as needed for rash in skin fold  Start Ketoconazole 2% cream: Apply twice daily to affected skin folds as needed for rash  Recommend Zeasorb AF for maintenance under breasts.    Recommend taking Heliocare sun protection supplement daily in sunny weather for additional sun protection. For maximum protection on the sunniest days, you can take up to 2 capsules of regular Heliocare OR take 1 capsule of Heliocare Ultra. For prolonged exposure (such as a full day in the sun), you can repeat your dose of the supplement 4 hours after your first dose. Heliocare can be purchased at [employer], at some [facility] or at [email or web address].     Recommend taking Vitamin D 2000 iu daily for 4 months then decrease to 1000 iu daily.    Recommend daily broad spectrum sunscreen SPF 30+ to sun-exposed areas, reapply every 2 hours as needed. Call for new or changing lesions.  Staying in the shade or wearing long sleeves, sun glasses (UVA+UVB protection) and wide brim hats (4-inch brim around the entire circumference of the hat) are also recommended for sun protection.      Melanoma ABCDEs  Melanoma is the most dangerous type of skin cancer, and is the leading cause of death from skin disease.  You are more likely to develop melanoma if you: Have light-colored skin, light-colored eyes, or red or blond hair Spend a lot of time in the sun Tan regularly, either outdoors or in a tanning bed Have had blistering sunburns, especially during childhood Have a close family member who has had a melanoma Have  atypical moles or large birthmarks  Early detection of melanoma is key since treatment is typically straightforward and cure rates are extremely high if we catch it early.   The first sign of melanoma is often a change in a mole or a new dark spot.  The ABCDE system is a way of remembering the signs of melanoma.  A for asymmetry:  The two halves do not match. B for border:  The edges of the growth are irregular. C for color:  A mixture of colors are present instead of an even brown color. D for diameter:  Melanomas are usually (but not always) greater than 42mm - the size of a pencil eraser. E for evolution:  The spot keeps changing in size, shape, and color.  Please check your skin once per month between visits. You can use a small mirror in front and a large mirror behind you to keep an eye on the back side or your body.   If you see any new or changing lesions before your next follow-up, please call to schedule a visit.  Please continue daily skin protection including broad spectrum sunscreen SPF 30+ to sun-exposed areas, reapplying every 2 hours as needed when you're outdoors.   Staying in the shade or wearing long sleeves, sun glasses (UVA+UVB protection) and wide brim hats (4-inch brim around the entire circumference of the hat) are also recommended for sun protection.       If You Need Anything After Your Visit  If you have any questions or concerns for  your doctor, please call our main line at [phone] and press option 4 to reach your doctor's medical assistant. If no one answers, please leave a voicemail as directed and we will return your call as soon as possible. Messages left after 4 pm will be answered the following business day.   You may also send [place] a message via [place]. We typically respond to MyChart messages within 1-2 business days.  For prescription refills, please ask your pharmacy to contact our office. Our fax number is [phone].  If you have an urgent  issue when the clinic is closed that cannot wait until the next business day, you can page your doctor at the number below.    Please note that while we do our best to be available for urgent issues outside of office hours, we are not available 24/7.   If you have an urgent issue and are unable to reach [place], you may choose to seek medical care at your doctor's office, retail clinic, urgent care center, or emergency room.  If you have a medical emergency, please immediately call 911 or go to the emergency department.  Pager Numbers  - Dr. Nehemiah Massed: [phone]  - Dr. Laurence Ferrari: [phone]  - Dr. Nicole Kindred: [phone]  In the event of inclement weather, please call our main line at [phone] for an update on the status of any delays or closures.  Dermatology Medication Tips: Please keep the boxes that topical medications come in in order to help keep track of the instructions about where and how to use these. Pharmacies typically print the medication instructions only on the boxes and not directly on the medication tubes.   If your medication is too expensive, please contact our office at [phone] option 4 or send [place] a message through [place].   We are unable to tell what your co-pay for medications will be in advance as this is different depending on your insurance coverage. However, we may be able to find a substitute medication at lower cost or fill out paperwork to get insurance to cover a needed medication.   If a prior authorization is required to get your medication covered by your insurance company, please allow [place] 1-2 business days to complete this process.  Drug prices often vary depending on where the prescription is filled and some pharmacies may offer cheaper prices.  The website www.goodrx.com contains coupons for medications through different pharmacies. The prices here do not account for what the cost may be with help from insurance (it may be cheaper with your  insurance), but the website can give you the price if you did not use any insurance.  - You can print the associated coupon and take it with your prescription to the pharmacy.  - You may also stop by our office during regular business hours and pick up a GoodRx coupon card.  - If you need your prescription sent electronically to a different pharmacy, notify our office through [facility] or by phone at [phone] option 4.     Si Usted Necesita Algo Despus de Su Visita  Tambin puede enviarnos un mensaje a travs de Pharmacist, community. Por lo general respondemos a los mensajes de MyChart en el transcurso de 1 a 2 das hbiles.  Para renovar recetas, por favor pida a su farmacia que se ponga en contacto con nuestra oficina. Harland Dingwall de fax es [place] [phone].  Si tiene un asunto urgente cuando la clnica est cerrada y que no puede esperar Museum/gallery curator  siguiente da hbil, puede llamar/localizar a su doctor(a) al nmero que aparece a continuacin.   Por favor, tenga en cuenta que aunque hacemos todo lo posible para estar disponibles para asuntos urgentes fuera del horario de [place], no estamos disponibles las 24 horas del da, los 7 das de la [place].   Si tiene un problema urgente y no puede comunicarse con nosotros, puede optar por buscar atencin mdica  en el consultorio de su doctor(a), en una clnica privada, en un centro de atencin urgente o en una sala de emergencias.  Si tiene Engineering geologist, por favor llame inmediatamente al 911 o vaya a la sala de emergencias.  Nmeros de bper  - Dr. Nehemiah Massed: [phone]  - Dra. Moye: [phone]  - Dra. Nicole Kindred: [phone]  En caso de inclemencias del [place], por favor llame a Johnsie Kindred principal al [phone] para una actualizacin sobre el [place] de cualquier retraso o cierre.  Consejos para la medicacin en dermatologa: Por favor, guarde las cajas en las que vienen los medicamentos de uso tpico para ayudarle a  seguir las instrucciones sobre dnde y cmo usarlos. Las farmacias generalmente imprimen las instrucciones del medicamento slo en las cajas y no directamente en los tubos del [place].   Si su medicamento es muy caro, por favor, pngase en contacto con Zigmund Daniel llamando al [phone] y presione la opcin 4 o envenos un mensaje a travs de Pharmacist, community.   No podemos decirle cul ser su copago por los medicamentos por adelantado ya que esto es diferente dependiendo de la cobertura de su seguro. Sin embargo, es posible que podamos encontrar un medicamento sustituto a Electrical engineer un formulario para que el seguro cubra el medicamento que se considera necesario.   Si se requiere una autorizacin previa para que su compaa de seguros [place] su medicamento, por favor permtanos de 1 a 2 das hbiles para completar este proceso.  Los precios de los medicamentos varan con frecuencia dependiendo del Environmental consultant de dnde se surte la receta y alguna farmacias pueden ofrecer precios ms baratos.  El sitio web www.goodrx.com tiene cupones para medicamentos de Airline pilot. Los precios aqu no tienen en cuenta lo que podra costar con la ayuda del seguro (puede ser ms barato con su seguro), pero el sitio web puede darle el precio si no utiliz Research scientist (physical sciences).  - Puede imprimir el cupn correspondiente y llevarlo con su receta a la farmacia.  - Tambin puede pasar por nuestra oficina durante el horario de atencin regular y Charity fundraiser una tarjeta de cupones de GoodRx.  - Si necesita que su receta se enve electrnicamente a una farmacia diferente, informe a nuestra oficina a travs de MyChart de [place] o por telfono llamando al [phone] y presione la opcin 4.

## 2022-01-18 NOTE — Progress Notes (Signed)
Follow-Up Visit   Subjective  Nicole Bartlett is a 56 y.o. female who presents for the following: Annual Exam (Here for skin cancer screening. Full body. Hx of AK's. No h/o skin cancer or DN) and Actinic Keratosis (4-6 week recheck. Upper cutaneous lip and dorsal right hand. S/P 5FU, Calcipotriene treated for 4 days on lip and 7 days on hand as directed. Improved but not completely gone on hand).  The patient presents for Total-Body Skin Exam (TBSE) for skin cancer screening and mole check.  The patient has spots, moles and lesions to be evaluated, some may be new or changing and the patient has concerns that these could be cancer.   The following portions of the chart were reviewed this encounter and updated as appropriate:  Tobacco   Allergies   Meds   Problems   Med Hx   Surg Hx   Fam Hx       Review of Systems: No other skin or systemic complaints except as noted in HPI or Assessment and Plan.   Objective  Well appearing patient in no apparent distress; mood and affect are within normal limits.  A full examination was performed including scalp, head, eyes, ears, nose, lips, neck, chest, axillae, abdomen, back, buttocks, bilateral upper extremities, bilateral lower extremities, hands, feet, fingers, toes, fingernails, and toenails. All findings within normal limits unless otherwise noted below.  Right Dorsal Hand x1 Erythematous thin papule with gritty scale.   breast folds Erythematous patches   Assessment & Plan   Lentigines - Scattered tan macules - Due to sun exposure - Benign-appearing, observe - Recommend daily broad spectrum sunscreen SPF 30+ to sun-exposed areas, reapply every 2 hours as needed. - Call for any changes  Seborrheic Keratoses - Stuck-on, waxy, tan-brown papules and/or plaques  - Benign-appearing - Discussed benign etiology and prognosis. - Observe - Call for any changes  Melanocytic Nevi - Tan-brown and/or pink-flesh-colored symmetric macules  and papules - Benign appearing on exam today - Observation - Call clinic for new or changing moles - Recommend daily use of broad spectrum spf 30+ sunscreen to sun-exposed areas.  -Check toenails when remove polish. -Unable to visualize left forearm and wrist due to cast.   Hemangiomas - Red papules - Discussed benign nature - Observe - Call for any changes  Actinic Damage - Chronic condition, secondary to cumulative UV/sun exposure - diffuse scaly erythematous macules with underlying dyspigmentation - Recommend daily broad spectrum sunscreen SPF 30+ to sun-exposed areas, reapply every 2 hours as needed.  - Staying in the shade or wearing long sleeves, sun glasses (UVA+UVB protection) and wide brim hats (4-inch brim around the entire circumference of the hat) are also recommended for sun protection.  - Call for new or changing lesions.  Skin cancer screening performed today.  AK (actinic keratosis) Right Dorsal Hand x1  Actinic keratoses are precancerous spots that appear secondary to cumulative UV radiation exposure/sun exposure over time. They are chronic with expected duration over 1 year. A portion of actinic keratoses will progress to squamous cell carcinoma of the skin. It is not possible to reliably predict which spots will progress to skin cancer and so treatment is recommended to prevent development of skin cancer.  Recommend daily broad spectrum sunscreen SPF 30+ to sun-exposed areas, reapply every 2 hours as needed.  Recommend staying in the shade or wearing long sleeves, sun glasses (UVA+UVB protection) and wide brim hats (4-inch brim around the entire circumference of the hat). Call for  new or changing lesions.  Call in 6-8 weeks if not resolved.  Prior to procedure, discussed risks of blister formation, small wound, skin dyspigmentation, or rare scar following cryotherapy. Recommend Vaseline ointment to treated areas while healing.   Destruction of lesion - Right  Dorsal Hand x1  Destruction method: cryotherapy   Informed consent: discussed and consent obtained   Lesion destroyed using liquid nitrogen: Yes   Outcome: patient tolerated procedure well with no complications   Post-procedure details: wound care instructions given    Related Medications fluorouracil (EFUDEX) 5 % cream Apply topically 2 (two) times daily. 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 Condition is bothersome/symptomatic for patient. Currently flared.  Intertrigo is a chronic recurrent rash that occurs in skin fold areas that may be associated with friction; heat; moisture; yeast; fungus; and bacteria.  It is exacerbated by increased movement / activity; sweating; and higher atmospheric temperature.  Start hydrocortisone 2.5% cream: Apply twice daily up to 2 weeks as needed for rash in skin fold  Start Ketoconazole 2% cream: Apply twice daily to affected skin folds as needed for rash   Recommend Zeasorb AF for maintenance under breasts.   hydrocortisone 2.5 % cream - breast folds Apply twice daily up to 2 weeks as needed for rash in skin folds  ketoconazole (NIZORAL) 2 % cream - breast folds Apply twice daily to affected skin folds as needed for rash   Return in about 1 year (around 01/18/2023) for TBSE.  I, Emelia Salisbury, CMA, am acting as scribe for Forest Gleason, MD.  Documentation: I have reviewed the above documentation for accuracy and completeness, and I agree with the above.  Forest Gleason, MD

## 2022-03-09 ENCOUNTER — Telehealth: Payer: Self-pay | Admitting: Neurology

## 2022-03-09 NOTE — Telephone Encounter (Signed)
LVM and MyChart message sent asking pt to call back to r/s 05/31 appointment- Judson Roch out of the office.  ?

## 2022-04-20 ENCOUNTER — Ambulatory Visit: Payer: BC Managed Care – PPO | Admitting: Neurology

## 2022-05-23 ENCOUNTER — Telehealth: Payer: Self-pay | Admitting: *Deleted

## 2022-05-23 NOTE — Telephone Encounter (Signed)
PA for Aimovig '140mg'$  started on covermymeds (key: [id number]). Pt has pharmacy coverage through [facility] [phone]). Decision pending.

## 2022-05-25 NOTE — Telephone Encounter (Signed)
PA approved through 05/23/2023.

## 2022-07-14 ENCOUNTER — Other Ambulatory Visit: Payer: Self-pay

## 2022-07-14 MED ORDER — RIZATRIPTAN BENZOATE 10 MG PO TABS: ORAL_TABLET | ORAL | 3 refills | Status: DC

## 2022-07-17 ENCOUNTER — Other Ambulatory Visit: Payer: Self-pay | Admitting: Neurology

## 2022-07-17 ENCOUNTER — Other Ambulatory Visit: Payer: Self-pay | Admitting: Dermatology

## 2022-07-17 DIAGNOSIS — L304 Erythema intertrigo: Secondary | ICD-10-CM

## 2022-07-27 NOTE — Progress Notes (Signed)
PATIENT: Nicole Bartlett DOB: 1966-01-03  REASON FOR VISIT: follow up for migraines  HISTORY FROM: patient PRIMARY NEUROLOGIST: Willis/Chima  HISTORY OF PRESENT ILLNESS: Today 07/28/22  Nicole Bartlett is here today for follow-up. Remains on Aimovig 140 mg, no side effect. Has about 2-3 migraines a month. Maxalt usually helps, if it doesn't excedrin migraine is a back up. No health issues. Seeing psychiatry. Still working at [employer], her granddaughter just started there.  No new issues or concerns.  Update 04/20/21 SS: Nicole Bartlett is a 56 year old female with history of headaches.  Doing well on Aimovig 140 mg monthly injection, denies side effect.  Is on Depakote from her psychiatrist.  Takes Maxalt with good benefit as needed.  Has OSA, but does not use BiPAP.  On average, reports 2 migraines a month, will start with Tylenol, if not helpful will proceed with Maxalt, usually only takes Maxalt once a month.  Had right Achilles tendon repair in December 2021.  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  Overall health has been good.  Here today for evaluation unaccompanied.  Is pleased with her headache control.  Update 04/16/2020 SS: [name] is a 56 year old female with history of headaches and OSA, does not use BiPAP.  She remains on Aimovig 140 mg monthly injection, she has come off Topamax.  Headaches remain well controlled.  She may have 2 migraines a month, does have fairly frequent " regular headache" easily taken care of with Tylenol.  Sees her psychiatrist monthly, is on Depakote.  Migraines respond well to Maxalt, usually takes twice a month with good benefit.  Denies side effects of medications.  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  She works as an Optometrist at [employer]. Having some right Achilles tendon issues.  Presents today for evaluation unaccompanied.  HISTORY  08/13/2019 SS: Nicole Bartlett is a 56 year old female with history of  headaches and obstructive sleep apnea.  She does not use her BiPAP.  She remains on Topamax 25 mg in the morning, 50 mg in the evening. Higher doses have caused her to have difficulty focusing. She is also taking Depakote, prescribed by her psychiatrist.  She takes Maxalt as needed for headache. She had right shoulder replacement surgery in August 2020.  She is also taking Aimovig prescribed by her psychiatrist for migraines.  She reports her headaches are doing well.  She received 3-4 Aimovig injections, but missed the injection last month.  She has remained on Topamax.  She says with Aimovig she has had an 80% reduction in headaches.  She has not had to take Maxalt in 2 months.  She reports her headaches are triggered by sun sensitivity, driving at night when the sun is going down.  She indicates her health has been good.  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  Her psychiatrist is at the [facility]. She does have tremor to bilateral hands and head titubation for several years. She reports family history of same.  She has a follow-up unaccompanied.  REVIEW OF SYSTEMS: Out of a complete 14 system review of symptoms, the patient complains only of the following symptoms, and all other reviewed systems are negative.  See HPI  ALLERGIES: Allergies  Allergen Reactions   Sulfa Antibiotics Itching and Rash    angioedema    Ibuprofen Other (See Comments)    Hx of  stomach ulcers    HOME MEDICATIONS: Outpatient Medications Prior to Visit  Medication Sig Dispense Refill   AIMOVIG 140 MG/ML SOAJ INJECT 140 MG INTO THE SKIN EVERY 28 (TWENTY-EIGHT) DAYS. 1 mL 11   buPROPion (WELLBUTRIN XL) 300 MG 24 hr tablet Take 300 mg by mouth daily.     busPIRone (BUSPAR) 10 MG tablet Take 10 mg by mouth 3 (three) times daily.     divalproex (DEPAKOTE ER) 500 MG 24 hr tablet Take 500 mg by mouth daily.      doxepin (SINEQUAN) 50 MG capsule Take 50 mg  by mouth at bedtime.     hydrocortisone 2.5 % cream Apply twice daily up to 2 weeks as needed for rash in skin folds 28 g 2   ketoconazole (NIZORAL) 2 % cream APPLY TWICE DAILY TO AFFECTED SKIN FOLDS AS NEEDED FOR RASH 60 g 2   methocarbamol (ROBAXIN) 500 MG tablet Take 500 mg by mouth 4 (four) times daily.      Oxycodone HCl 10 MG TABS Take 10 mg by mouth 3 (three) times daily.      pantoprazole (PROTONIX) 40 MG tablet Take 40 mg by mouth daily as needed (heartburn).   5   propranolol (INDERAL) 10 MG tablet SMARTSIG:0.5-1 Tablet(s) By Mouth 1-3 Times Daily PRN     REXULTI 2 MG TABS Take 2 mg by mouth daily.      rizatriptan (MAXALT) 10 MG tablet May repeat in 2 hours if needed 18 tablet 3   XTAMPZA ER 18 MG C12A Take 1 capsule by mouth 2 (two) times daily.     fluorouracil (EFUDEX) 5 % cream Apply topically 2 (two) times daily. Apply twice daily for 4 days as directed 15 g 0   No facility-administered medications prior to visit.    PAST MEDICAL HISTORY: Past Medical History:  Diagnosis Date   Anxiety and depression    Chronic neck pain    Fibromyalgia    GERD (gastroesophageal reflux disease)    Headache    migraines   Narcotic dependence ([place])    Sleep apnea    Tremor     PAST SURGICAL HISTORY: Past Surgical History:  Procedure Laterality Date   C5/C6 fusion  2010   C5/C7 fusion  2002   CESAREAN SECTION     times 2   GALLBLADDER SURGERY     REVERSE SHOULDER ARTHROPLASTY Right 06/28/2019   Procedure: REVERSE SHOULDER ARTHROPLASTY;  Surgeon: Netta Cedars, MD;  Location: [facility];  Service: Orthopedics;  Laterality: Right;  with interscalene block   ROTATOR CUFF REPAIR Bilateral     FAMILY HISTORY: Family History  Problem Relation Age of Onset   Cancer Mother    Dementia Mother    Diabetes Father    Migraines Sister    Fibroids Daughter     SOCIAL HISTORY: Social History   Socioeconomic History   Marital status: Divorced    Spouse name: Not on file   Number of  children: 2   Years of education: Not on file   Highest education level: Not on file  Occupational History   Occupation: Engineer, maintenance (IT)    Employer: [employer]  Tobacco Use   Smoking status: Every Day    Packs/day: 0.25    Years: 15.00    Total pack years: 3.75    Types: Cigarettes    Passive exposure: Never   Smokeless tobacco: Never  Vaping Use   Vaping Use: Every day   Start date: 06/10/2019  Substances: Nicotine, CBD, Flavoring  Substance and Sexual Activity   Alcohol use: Yes    Alcohol/week: 0.0 standard drinks of alcohol    Comment: rare   Drug use: No   Sexual activity: Not on file  Other Topics Concern   Not on file  Social History Narrative   Patient is separated.   Patient has 2 children   Patient lives at home with daughter.   Patient drinks caffeine everyday until 5pm.         Social Determinants of Health   Financial Resource Strain: Not on file  Food Insecurity: Not on file  Transportation Needs: Not on file  Physical Activity: Not on file  Stress: Not on file  Social Connections: Not on file  Intimate Partner Violence: Not on file   PHYSICAL EXAM  Vitals:   07/28/22 0839  BP: 122/83  Pulse: 73  Weight: 245 lb 8 oz (111.4 kg)  Height: '5\' 4"'$  (1.626 m)   Body mass index is 42.14 kg/m.  Generalized: Well developed, in no acute distress   Neurological examination  Mentation: Alert oriented to time, place, history taking. Follows all commands speech and language fluent Cranial nerve II-XII: Pupils were equal round reactive to light. Extraocular movements were full, visual field were full on confrontational test. Facial sensation and strength were normal. Head turning and shoulder shrug were normal and symmetric. Motor: The motor testing reveals 5 over 5 strength of all 4 extremities. Good symmetric motor tone is noted throughout.  Sensory: Sensory testing is intact to soft touch on all 4 extremities. No evidence of extinction is noted.  Coordination:  Cerebellar testing reveals good finger-nose-finger and heel-to-shin bilaterally.  Gait and station: Gait is normal.  Reflexes: Deep tendon reflexes are symmetric and normal bilaterally.   DIAGNOSTIC DATA (LABS, IMAGING, TESTING) - I reviewed patient records, labs, notes, testing and imaging myself where available.  Lab Results  Component Value Date   WBC 5.7 06/26/2019   HGB 10.4 (L) 06/29/2019   HCT 34.0 (L) 06/29/2019   MCV 95.7 06/26/2019   PLT 233 06/26/2019      Component Value Date/Time   NA 135 06/29/2019 0305   K 4.2 06/29/2019 0305   CL 104 06/29/2019 0305   CO2 22 06/29/2019 0305   GLUCOSE 153 (H) 06/29/2019 0305   BUN 10 06/29/2019 0305   CREATININE 0.66 06/29/2019 0305   CALCIUM 8.2 (L) 06/29/2019 0305   GFRNONAA >60 06/29/2019 0305   GFRAA >60 06/29/2019 0305   No results found for: "CHOL", "HDL", "LDLCALC", "LDLDIRECT", "TRIG", "CHOLHDL" No results found for: "HGBA1C" No results found for: "VITAMINB12" No results found for: "TSH"  ASSESSMENT AND PLAN 56 y.o. year old female  has a past medical history of Anxiety and depression, Chronic neck pain, Fibromyalgia, GERD (gastroesophageal reflux disease), Headache, Narcotic dependence ([place]), Sleep apnea, and Tremor. here with:  1.  Chronic migraine headache  -Continues to do very well -Continue Aimovig 140 mg monthly for migraine prevention -Continue Maxalt as needed for acute headache treatment -Also on Depakote, from other providers that may help with -Follow-up with me in 1 year or sooner if needed -Looks like refills were just sent in prior to this appointment  Butler Denmark, AGNP-C, DNP 07/28/2022, 9:00 AM [facility] [address] [place], [place] [address] [phone]

## 2022-07-28 ENCOUNTER — Encounter: Payer: Self-pay | Admitting: Neurology

## 2022-07-28 ENCOUNTER — Ambulatory Visit: Payer: BC Managed Care – PPO | Admitting: Neurology

## 2022-07-28 DIAGNOSIS — G43709 Chronic migraine without aura, not intractable, without status migrainosus: Secondary | ICD-10-CM

## 2022-07-28 NOTE — Patient Instructions (Signed)
Great to see you today! I am glad you are doing well! Continue Aimovig for headache prevention, use Maxalt as needed, please let me know if we need to make any medication adjustments. I will see you back in 1 year!

## 2022-11-10 ENCOUNTER — Other Ambulatory Visit: Payer: Self-pay | Admitting: Neurology

## 2022-12-06 ENCOUNTER — Ambulatory Visit (INDEPENDENT_AMBULATORY_CARE_PROVIDER_SITE_OTHER): Payer: Self-pay | Admitting: Physician Assistant

## 2022-12-06 ENCOUNTER — Encounter: Payer: Self-pay | Admitting: Physician Assistant

## 2022-12-06 VITALS — BP 122/78 | HR 89 | Temp 97.3°F | Wt 248.8 lb

## 2022-12-06 DIAGNOSIS — U071 COVID-19: Secondary | ICD-10-CM

## 2022-12-06 DIAGNOSIS — Z20822 Contact with and (suspected) exposure to covid-19: Secondary | ICD-10-CM

## 2022-12-06 LAB — POC COVID19 BINAXNOW: SARS Coronavirus 2 Ag: POSITIVE — AB

## 2022-12-06 NOTE — Progress Notes (Signed)
[facility] [address], [place] [address]   Office Visit Note  Patient Name: Nicole Bartlett Date of Birth [id number]  Medical Record number [id number]  Date of Service: 12/06/2022  Chief Complaint  Patient presents with   Covid Exposure    Was in [place] and exposed to [place]. Slept all day Sun. Feels run down, scratchy throat, blurry eyes, on and off fever, chills, congested.     57 y/o F presents to the clinic for c/o post nasal drainage, cough, and feeling fatigued x 2-3 days. +known exposure to covid. Denies taking otc medications currently for the symptoms.       Current Medication:  Outpatient Encounter Medications as of 12/06/2022  Medication Sig   AIMOVIG 140 MG/ML SOAJ INJECT 140 MG INTO THE SKIN EVERY 28 (TWENTY-EIGHT) DAYS.   buPROPion (WELLBUTRIN XL) 300 MG 24 hr tablet Take 300 mg by mouth daily.   busPIRone (BUSPAR) 10 MG tablet Take 10 mg by mouth 3 (three) times daily.   divalproex (DEPAKOTE ER) 500 MG 24 hr tablet Take 500 mg by mouth daily.    doxepin (SINEQUAN) 50 MG capsule Take 50 mg by mouth at bedtime.   methocarbamol (ROBAXIN) 500 MG tablet Take 500 mg by mouth 4 (four) times daily.    Oxycodone HCl 10 MG TABS Take 10 mg by mouth 3 (three) times daily.    propranolol (INDERAL) 10 MG tablet SMARTSIG:0.5-1 Tablet(s) By Mouth 1-3 Times Daily PRN   REXULTI 2 MG TABS Take 2 mg by mouth daily.    XTAMPZA ER 18 MG C12A Take 1 capsule by mouth 2 (two) times daily.   hydrocortisone 2.5 % cream Apply twice daily up to 2 weeks as needed for rash in skin folds   ketoconazole (NIZORAL) 2 % cream APPLY TWICE DAILY TO AFFECTED SKIN FOLDS AS NEEDED FOR RASH   pantoprazole (PROTONIX) 40 MG tablet Take 40 mg by mouth daily as needed (heartburn).    rizatriptan (MAXALT) 10 MG tablet MAY REPEAT IN 2 HOURS IF NEEDED   No facility-administered encounter medications on file as of 12/06/2022.      Medical History: Past Medical History:  Diagnosis Date    Anxiety and depression    Chronic neck pain    Fibromyalgia    GERD (gastroesophageal reflux disease)    Headache    migraines   Narcotic dependence ([facility])    Sleep apnea    Tremor      Vital Signs: BP 122/78 (BP Location: Left Arm, Patient Position: Sitting, Cuff Size: Large)   Pulse 89   Temp (!) 97.3 F (36.3 C) (Tympanic)   Wt 248 lb 12.8 oz (112.9 kg)   SpO2 99%   BMI 42.71 kg/m    Review of Systems  Constitutional:  Positive for chills.  HENT:  Positive for congestion, postnasal drip and sore throat. Negative for trouble swallowing.   Respiratory:  Positive for cough. Negative for chest tightness, shortness of breath and wheezing.   Cardiovascular: Negative.     Physical Exam Constitutional:      Appearance: Normal appearance.  HENT:     Head: Atraumatic.     Right Ear: Tympanic membrane, ear canal and external ear normal.     Left Ear: Tympanic membrane, ear canal and external ear normal.     Nose: Nose normal.     Mouth/Throat:     Mouth: Mucous membranes are moist.     Pharynx: Oropharynx is clear.  Eyes:     Extraocular Movements: Extraocular movements intact.  Cardiovascular:     Rate and Rhythm: Normal rate and regular rhythm.  Pulmonary:     Effort: Pulmonary effort is normal.     Breath sounds: Normal breath sounds.  Musculoskeletal:     Cervical back: Neck supple.  Skin:    General: Skin is warm.  Neurological:     Mental Status: She is alert.  Psychiatric:        Mood and Affect: Mood normal.        Behavior: Behavior normal.        Thought Content: Thought content normal.        Judgment: Judgment normal.       Assessment/Plan:  1. COVID-19 virus infection  2. Exposure to COVID-19 virus - POC COVID-19 BinaxNow  Reviewed positive covid test with patient.  Take otc oral decongestant ie sudafed Take otc Mucinex as directed on the box. Reviewed CDC guidelines with patient. Pt will be isolated for 5 days from the start of her  symptoms. Needs to be fever free for 24 hours without the aid of medicine before returning back to work. She must wear a mask for an additional 5 days.    General Counseling: Nicole Bartlett verbalizes understanding of the findings of todays visit and agrees with plan of treatment. I have discussed any further diagnostic evaluation that may be needed or ordered today. We also reviewed her medications today. she has been encouraged to call the office with any questions or concerns that should arise related to todays visit.    Time spent:30 [place], [place] Physician Assistant

## 2022-12-14 ENCOUNTER — Ambulatory Visit (INDEPENDENT_AMBULATORY_CARE_PROVIDER_SITE_OTHER): Payer: Self-pay | Admitting: Family

## 2022-12-14 ENCOUNTER — Encounter: Payer: Self-pay | Admitting: Family

## 2022-12-14 DIAGNOSIS — U071 COVID-19: Secondary | ICD-10-CM

## 2022-12-14 NOTE — Progress Notes (Signed)
  Subjective:     Patient ID: Nicole Bartlett, female   DOB: 01-03-1966, 57 y.o.   MRN: [id number]  URI  The current episode started 1 to 4 weeks ago. The problem has been waxing and waning. There has been no fever. Associated symptoms include diarrhea and a sore throat. She has tried acetaminophen for the symptoms. The treatment provided mild relief.     Review of Systems  Constitutional:  Positive for fatigue.  HENT:  Positive for sore throat.   Gastrointestinal:  Positive for diarrhea.       Objective:   Physical Exam Unable to perform Physical exam, telephone visit.     Assessment:     Ongoing Covid-19 symptoms.  Initial symptoms occurred on 12/04/22. Seen in clinic and tested + on 12/06/22. Felt symptoms were starting to improve, but diarrhea returned yesterday.  Her sore throat started again today.     Plan:     Encouraged to drink plenty of fluids- concerns for dehydration due to continued loose stools. Pedialyte or Gatorade 1 time a day.  Tylenol / Advil as needed for fever or pain. Imodium for diarrhea. Follow package instructions.  RCT if condition worsens or of diarrhea is not controlled by Monday.

## 2022-12-14 NOTE — Progress Notes (Signed)
Patient ID: Nicole Bartlett, female   DOB: 01/11/1966, 57 y.o.   MRN: [id number]

## 2023-01-26 ENCOUNTER — Ambulatory Visit: Payer: BC Managed Care – PPO | Admitting: Dermatology

## 2023-01-26 VITALS — BP 140/96 | HR 78

## 2023-01-26 DIAGNOSIS — L570 Actinic keratosis: Secondary | ICD-10-CM

## 2023-01-26 DIAGNOSIS — L578 Other skin changes due to chronic exposure to nonionizing radiation: Secondary | ICD-10-CM

## 2023-01-26 DIAGNOSIS — Z1283 Encounter for screening for malignant neoplasm of skin: Secondary | ICD-10-CM

## 2023-01-26 DIAGNOSIS — L719 Rosacea, unspecified: Secondary | ICD-10-CM

## 2023-01-26 DIAGNOSIS — L814 Other melanin hyperpigmentation: Secondary | ICD-10-CM

## 2023-01-26 DIAGNOSIS — L304 Erythema intertrigo: Secondary | ICD-10-CM

## 2023-01-26 DIAGNOSIS — L821 Other seborrheic keratosis: Secondary | ICD-10-CM

## 2023-01-26 DIAGNOSIS — D229 Melanocytic nevi, unspecified: Secondary | ICD-10-CM

## 2023-01-26 MED ORDER — FLUOROURACIL 5 % EX CREA: TOPICAL_CREAM | Freq: Two times a day (BID) | CUTANEOUS | 0 refills | Status: DC

## 2023-01-26 NOTE — Patient Instructions (Addendum)
Intertrigo is a chronic recurrent rash that occurs in skin fold areas that may be associated with friction; heat; moisture; yeast; fungus; and bacteria.  It is exacerbated by increased movement / activity; sweating; and higher atmospheric temperature.  Recommend OTC Zeasorb AF powder to body folds daily after shower.  It is often found in the athlete's foot section in the pharmacy.  Avoid using powders that contain cornstarch.  Continue hydrocortisone 2.5% cream: Apply twice daily up to 2 weeks as needed for rash in skin fold   Continue Ketoconazole 2% cream: Apply twice daily to affected skin folds as needed for rash  Reviewed course of treatment and expected reaction.  Patient advised to expect inflammation and crusting and advised that erosions are possible.  Patient advised to be diligent with sun protection during and after treatment. Handout with details of how to apply medication and what to expect provided. Counseled to keep medication out of reach of children and pets.  - Start 5-fluorouracil cream twice a day for 7 days to affected areas including upper lip.  Reviewed course of treatment and expected reaction.  Patient advised to expect inflammation and crusting and advised that erosions are possible.  Patient advised to be diligent with sun protection during and after treatment. Handout with details of how to apply medication and what to expect provided. Counseled to keep medication out of reach of children and pets.  Your prescription was sent to [facility] in [place]. A representative from [facility] will contact you within 2 business hours to verify your address and insurance information to schedule a free delivery. If for any reason you do not receive a phone call from them, please reach out to them. Their phone number is [phone] and their hours are Monday-Friday 9:00 am-5:00 pm.    Recommend Vitamin D 600iu daily.   Recommend taking Heliocare sun protection  supplement daily in sunny weather for additional sun protection. For maximum protection on the sunniest days, you can take up to 2 capsules of regular Heliocare OR take 1 capsule of Heliocare Ultra. For prolonged exposure (such as a full day in the sun), you can repeat your dose of the supplement 4 hours after your first dose. Heliocare can be purchased at [employer], at some [facility] or at [email or web address].    Cryotherapy Aftercare  Wash gently with soap and water everyday.   Apply Vaseline and Band-Aid daily until healed.    Melanoma ABCDEs  Melanoma is the most dangerous type of skin cancer, and is the leading cause of death from skin disease.  You are more likely to develop melanoma if you: Have light-colored skin, light-colored eyes, or red or blond hair Spend a lot of time in the sun Tan regularly, either outdoors or in a tanning bed Have had blistering sunburns, especially during childhood Have a close family member who has had a melanoma Have atypical moles or large birthmarks  Early detection of melanoma is key since treatment is typically straightforward and cure rates are extremely high if we catch it early.   The first sign of melanoma is often a change in a mole or a new dark spot.  The ABCDE system is a way of remembering the signs of melanoma.  A for asymmetry:  The two halves do not match. B for border:  The edges of the growth are irregular. C for color:  A mixture of colors are present instead of an even brown color. D for diameter:  Melanomas are  usually (but not always) greater than 79m - the size of a pencil eraser. E for evolution:  The spot keeps changing in size, shape, and color.  Please check your skin once per month between visits. You can use a small mirror in front and a large mirror behind you to keep an eye on the back side or your body.   If you see any new or changing lesions before your next follow-up, please call to schedule a  visit.  Please continue daily skin protection including broad spectrum sunscreen SPF 30+ to sun-exposed areas, reapplying every 2 hours as needed when you're outdoors.    Due to recent changes in healthcare laws, you may see results of your pathology and/or laboratory studies on MyChart before the doctors have had a chance to review them. We understand that in some cases there may be results that are confusing or concerning to you. Please understand that not all results are received at the same time and often the doctors may need to interpret multiple results in order to provide you with the best plan of care or course of treatment. Therefore, we ask that you please give u[place]2 business days to thoroughly review all your results before contacting the office for clarification. Should we see a critical lab result, you will be contacted sooner.   If You Need Anything After Your Visit  If you have any questions or concerns for your doctor, please call our main line at [phone] press option 4 to reach your doctor's medical assistant. If no one answers, please leave a voicemail as directed and we will return your call as soon as possible. Messages left after 4 pm will be answered the following business day.   You may also send u[place] message via [facility] We typically respond to MyChart messages within 1-2 business days.  For prescription refills, please ask your pharmacy to contact our office. Our fax number is [phone]  If you have an urgent issue when the clinic is closed that cannot wait until the next business day, you can page your doctor at the number below.    Please note that while we do our best to be available for urgent issues outside of office hours, we are not available 24/7.   If you have an urgent issue and are unable to reach u[place] you may choose to seek medical care at your doctor's office, retail clinic, urgent care center, or emergency room.  If you have a medical emergency,  please immediately call 911 or go to the emergency department.  Pager Numbers  - Dr. KNehemiah Massed [phone] - Dr. MLaurence Ferrari [phone] - Dr. SNicole Kindred [phone] In the event of inclement weather, please call our main line at [phone] an update on the status of any delays or closures.  Dermatology Medication Tips: Please keep the boxes that topical medications come in in order to help keep track of the instructions about where and how to use these. Pharmacies typically print the medication instructions only on the boxes and not directly on the medication tubes.   If your medication is too expensive, please contact our office at [phone] 4 or send [place] message through [facility] [place]   We are unable to tell what your co-pay for medications will be in advance as this is different depending on your insurance coverage. However, we may be able to find a substitute medication at lower cost or fill out paperwork to get insurance to cover  a needed medication.   If a prior authorization is required to get your medication covered by your insurance company, please allow [place] 1-2 business days to complete this process.  Drug prices often vary depending on where the prescription is filled and some pharmacies may offer cheaper prices.  The website www.goodrx.com contains coupons for medications through different pharmacies. The prices here do not account for what the cost may be with help from insurance (it may be cheaper with your insurance), but the website can give you the price if you did not use any insurance.  - You can print the associated coupon and take it with your prescription to the pharmacy.  - You may also stop by our office during regular business hours and pick up a GoodRx coupon card.  - If you need your prescription sent electronically to a different pharmacy, notify our office through [facility] or by phone at [phone] option 4.     Si Usted Necesita Algo  Despus de Su Visita  Tambin puede enviarnos un mensaje a travs de Pharmacist, community. Por lo general respondemos a los mensajes de MyChart en el transcurso de 1 a 2 das hbiles.  Para renovar recetas, por favor pida a su farmacia que se ponga en contacto con nuestra oficina. Harland Dingwall de fax es [place] [phone].  Si tiene un asunto urgente cuando la clnica est cerrada y que no puede esperar hasta el siguiente da hbil, puede llamar/localizar a su doctor(a) al nmero que aparece a continuacin.   Por favor, tenga en cuenta que aunque hacemos todo lo posible para estar disponibles para asuntos urgentes fuera del horario de [employer], no estamos disponibles las 24 horas del da, los 7 das de la [place].   Si tiene un problema urgente y no puede comunicarse con nosotros, puede optar por buscar atencin mdica  en el consultorio de su doctor(a), en una clnica privada, en un centro de atencin urgente o en una sala de emergencias.  Si tiene Engineering geologist, por favor llame inmediatamente al 911 o vaya a la sala de emergencias.  Nmeros de bper  - Dr. Nehemiah Massed: [phone]  - Dra. Moye: [phone]  - Dra. Nicole Kindred: [phone]  En caso de inclemencias del [place], por favor llame a Johnsie Kindred principal al [phone] para una actualizacin sobre el [place] de cualquier retraso o cierre.  Consejos para la medicacin en dermatologa: Por favor, guarde las cajas en las que vienen los medicamentos de uso tpico para ayudarle a seguir las instrucciones sobre dnde y cmo usarlos. Las farmacias generalmente imprimen las instrucciones del medicamento slo en las cajas y no directamente en los tubos del [place].   Si su medicamento es muy caro, por favor, pngase en contacto con Zigmund Daniel llamando al [phone] y presione la opcin 4 o envenos un mensaje a travs de Pharmacist, community.   No podemos decirle cul ser su copago por los medicamentos por adelantado ya que esto es diferente  dependiendo de la cobertura de su seguro. Sin embargo, es posible que podamos encontrar un medicamento sustituto a Electrical engineer un formulario para que el seguro cubra el medicamento que se considera necesario.   Si se requiere una autorizacin previa para que su compaa de seguros [place] su medicamento, por favor permtanos de 1 a 2 das hbiles para completar este proceso.  Los precios de los medicamentos varan con frecuencia dependiendo del Environmental consultant de dnde se surte la receta y alguna farmacias pueden ofrecer precios ms baratos.  El sitio web www.goodrx.com tiene cupones para medicamentos de Airline pilot. Los precios aqu no tienen en cuenta lo que podra costar con la ayuda del seguro (puede ser ms barato con su seguro), pero el sitio web puede darle el precio si no utiliz Research scientist (physical sciences).  - Puede imprimir el cupn correspondiente y llevarlo con su receta a la farmacia.  - Tambin puede pasar por nuestra oficina durante el horario de atencin regular y Charity fundraiser una tarjeta de cupones de GoodRx.  - Si necesita que su receta se enve electrnicamente a una farmacia diferente, informe a nuestra oficina a travs de MyChart de [place] o por telfono llamando al [phone] y presione la opcin 4.

## 2023-01-26 NOTE — Progress Notes (Signed)
Follow-Up Visit   Subjective  Nicole Bartlett is a 57 y.o. female who presents for the following: TBSE  (The patient presents for Total-Body Skin Exam (TBSE) for skin cancer screening and mole check.  The patient has spots, moles and lesions to be evaluated, some may be new or changing and the patient has concerns that these could be cancer. Patient advises spot at lip treated in the past with topical cream was scaly around Christmas but has improved, seems to come and go. /) and intertrigo (Under breasts. Patient is using ketoconazole and HC cream. Patient advises it clears with topical creams but comes back, she has not tried Zeasorb AF powder. ).   The following portions of the chart were reviewed this encounter and updated as appropriate:   Tobacco  Allergies  Meds  Problems  Med Hx  Surg Hx  Fam Hx      Review of Systems:  No other skin or systemic complaints except as noted in HPI or Assessment and Plan.  Objective  Well appearing patient in no apparent distress; mood and affect are within normal limits.  A full examination was performed including scalp, head, eyes, ears, nose, lips, neck, chest, axillae, abdomen, back, buttocks, bilateral upper extremities, bilateral lower extremities, hands, feet, fingers, toes, fingernails, and toenails. All findings within normal limits unless otherwise noted below.  Inframammary Erythematous patches  right lateral calf x 1 Erythematous thin papules/macules with gritty scale.   face Mid face erythema     Assessment & Plan  Erythema intertrigo Inframammary  Intertrigo is a chronic recurrent rash that occurs in skin fold areas that may be associated with friction; heat; moisture; yeast; fungus; and bacteria.  It is exacerbated by increased movement / activity; sweating; and higher atmospheric temperature.  Chronic and persistent condition with duration or expected duration over one year. Condition is symptomatic/ bothersome to  patient. Not currently at goal.  Recommend OTC Zeasorb AF powder to body folds daily after shower.  It is often found in the athlete's foot section in the pharmacy.  Avoid using powders that contain cornstarch.  Continue hydrocortisone 2.5% cream: Apply twice daily up to 2 weeks as needed for rash in skin fold   Continue Ketoconazole 2% cream: Apply twice daily to affected skin folds as needed for rash  AK (actinic keratosis) right lateral calf x 1  Actinic keratoses are precancerous spots that appear secondary to cumulative UV radiation exposure/sun exposure over time. They are chronic with expected duration over 1 year. A portion of actinic keratoses will progress to squamous cell carcinoma of the skin. It is not possible to reliably predict which spots will progress to skin cancer and so treatment is recommended to prevent development of skin cancer.  Recommend daily broad spectrum sunscreen SPF 30+ to sun-exposed areas, reapply every 2 hours as needed.  Recommend staying in the shade or wearing long sleeves, sun glasses (UVA+UVB protection) and wide brim hats (4-inch brim around the entire circumference of the hat). Call for new or changing lesions.  Prior to procedure, discussed risks of blister formation, small wound, skin dyspigmentation, or rare scar following cryotherapy. Recommend Vaseline ointment to treated areas while healing.  Recheck in 6-10 weeks.   Destruction of lesion - right lateral calf x 1  Destruction method: cryotherapy   Informed consent: discussed and consent obtained   Lesion destroyed using liquid nitrogen: Yes   Cryotherapy cycles:  2 Outcome: patient tolerated procedure well with no complications  Post-procedure details: wound care instructions given    Related Medications fluorouracil (EFUDEX) 5 % cream Apply topically 2 (two) times daily. To affected area at upper lip for 7 days  Rosacea face  Rosacea is a chronic progressive skin condition  usually affecting the face of adults, causing redness and/or acne bumps. It is treatable but not curable. It sometimes affects the eyes (ocular rosacea) as well. It may respond to topical and/or systemic medication and can flare with stress, sun exposure, alcohol, exercise, topical steroids (including hydrocortisone/cortisone 10) and some foods.  Daily application of broad spectrum spf 30+ sunscreen to face is recommended to reduce flares.  Denies ocular symptoms  Patient deferred treatment at this time.    Lentigines - Scattered tan macules - Due to sun exposure - Benign-appearing, observe - Recommend daily broad spectrum sunscreen SPF 30+ to sun-exposed areas, reapply every 2 hours as needed. - Call for any changes  Seborrheic Keratoses - Stuck-on, waxy, tan-brown papules and/or plaques  - Benign-appearing - Discussed benign etiology and prognosis. - Observe - Call for any changes  Melanocytic Nevi - Tan-brown and/or pink-flesh-colored symmetric macules and papules - Benign appearing on exam today - Observation - Call clinic for new or changing moles - Recommend daily use of broad spectrum spf 30+ sunscreen to sun-exposed areas.   Hemangiomas - Red papules - Discussed benign nature - Observe - Call for any changes  Actinic Damage with PreCancerous Actinic Keratoses Counseling for Topical Chemotherapy Management: Patient exhibits: - Severe, confluent actinic changes with pre-cancerous actinic keratoses that is secondary to cumulative UV radiation exposure over time - Condition that is severe; chronic, not at goal. - diffuse scaly erythematous macules and papules with underlying dyspigmentation - Discussed Prescription "Field Treatment" topical Chemotherapy for Severe, Chronic Confluent Actinic Changes with Pre-Cancerous Actinic Keratoses Field treatment involves treatment of an entire area of skin that has confluent Actinic Changes (Sun/ Ultraviolet light damage) and  PreCancerous Actinic Keratoses by method of PhotoDynamic Therapy (PDT) and/or prescription Topical Chemotherapy agents such as 5-fluorouracil, 5-fluorouracil/calcipotriene, and/or imiquimod.  The purpose is to decrease the number of clinically evident and subclinical PreCancerous lesions to prevent progression to development of skin cancer by chemically destroying early precancer changes that may or may not be visible.  It has been shown to reduce the risk of developing skin cancer in the treated area. As a result of treatment, redness, scaling, crusting, and open sores may occur during treatment course. One or more than one of these methods may be used and may have to be used several times to control, suppress and eliminate the PreCancerous changes. Discussed treatment course, expected reaction, and possible side effects. - Recommend daily broad spectrum sunscreen SPF 30+ to sun-exposed areas, reapply every 2 hours as needed.  - Staying in the shade or wearing long sleeves, sun glasses (UVA+UVB protection) and wide brim hats (4-inch brim around the entire circumference of the hat) are also recommended. - Call for new or changing lesions. - Start 5-fluorouracil cream twice a day for 7 days to affected areas including upper lip.  Reviewed course of treatment and expected reaction.  Patient advised to expect inflammation and crusting and advised that erosions are possible.  Patient advised to be diligent with sun protection during and after treatment. Handout with details of how to apply medication and what to expect provided. Counseled to keep medication out of reach of children and pets.   Skin cancer screening performed today.  Return in about 1 year (around  01/26/2024) for TBSE, AK follow up in 6-10 weeks.  Graciella Belton, RMA, am acting as scribe for Forest Gleason, MD .   Documentation: I have reviewed the above documentation for accuracy and completeness, and I agree with the above.  Forest Gleason, MD

## 2023-01-29 ENCOUNTER — Encounter: Payer: Self-pay | Admitting: Dermatology

## 2023-01-30 ENCOUNTER — Other Ambulatory Visit: Payer: Self-pay | Admitting: Family Medicine

## 2023-01-30 DIAGNOSIS — F17210 Nicotine dependence, cigarettes, uncomplicated: Secondary | ICD-10-CM

## 2023-02-27 ENCOUNTER — Ambulatory Visit
Admission: RE | Admit: 2023-02-27 | Discharge: 2023-02-27 | Disposition: A | Discharge status: Home / Self Care | Payer: BC Managed Care – PPO | Source: Ambulatory Visit | Attending: Family Medicine | Admitting: Family Medicine

## 2023-02-27 DIAGNOSIS — F17210 Nicotine dependence, cigarettes, uncomplicated: Secondary | ICD-10-CM

## 2023-03-16 ENCOUNTER — Ambulatory Visit: Payer: BC Managed Care – PPO | Admitting: Dermatology

## 2023-03-23 ENCOUNTER — Ambulatory Visit: Payer: BC Managed Care – PPO | Admitting: Dermatology

## 2023-03-23 ENCOUNTER — Encounter: Payer: Self-pay | Admitting: Dermatology

## 2023-03-23 VITALS — BP 112/77 | HR 77

## 2023-03-23 DIAGNOSIS — L570 Actinic keratosis: Secondary | ICD-10-CM

## 2023-03-23 DIAGNOSIS — L578 Other skin changes due to chronic exposure to nonionizing radiation: Secondary | ICD-10-CM

## 2023-03-23 NOTE — Patient Instructions (Addendum)
Re-Start 5-fluorouracil cream twice a day for 7 days to affected areas including upper cutaneous lip.  Reviewed course of treatment and expected reaction.  Patient advised to expect inflammation and crusting and advised that erosions are possible.  Patient advised to be diligent with sun protection during and after treatment. Handout with details of how to apply medication and what to expect provided. Counseled to keep medication out of reach of children and pets.  Reviewed course of treatment and expected reaction.  Patient advised to expect inflammation and crusting and advised that erosions are possible.  Patient advised to be diligent with sun protection during and after treatment. Handout with details of how to apply medication and what to expect provided. Counseled to keep medication out of reach of children and pets.      Recommend daily broad spectrum sunscreen SPF 30+ to sun-exposed areas, reapply every 2 hours as needed. Call for new or changing lesions.  Staying in the shade or wearing long sleeves, sun glasses (UVA+UVB protection) and wide brim hats (4-inch brim around the entire circumference of the hat) are also recommended for sun protection.    Recommend taking Heliocare sun protection supplement daily in sunny weather for additional sun protection. For maximum protection on the sunniest days, you can take up to 2 capsules of regular Heliocare OR take 1 capsule of Heliocare Ultra. For prolonged exposure (such as a full day in the sun), you can repeat your dose of the supplement 4 hours after your first dose. Heliocare can be purchased at [employer], at some [facility] or at [email or web address].     Due to recent changes in healthcare laws, you may see results of your pathology and/or laboratory studies on MyChart before the doctors have had a chance to review them. We understand that in some cases there may be results that are confusing or concerning to you. Please understand  that not all results are received at the same time and often the doctors may need to interpret multiple results in order to provide you with the best plan of care or course of treatment. Therefore, we ask that you please give [place] 2 business days to thoroughly review all your results before contacting the office for clarification. Should we see a critical lab result, you will be contacted sooner.   If You Need Anything After Your Visit  If you have any questions or concerns for your doctor, please call our main line at [phone] and press option 4 to reach your doctor's medical assistant. If no one answers, please leave a voicemail as directed and we will return your call as soon as possible. Messages left after 4 pm will be answered the following business day.   You may also send [place] a message via MyChart. We typically respond to MyChart messages within 1-2 business days.  For prescription refills, please ask your pharmacy to contact our office. Our fax number is [phone].  If you have an urgent issue when the clinic is closed that cannot wait until the next business day, you can page your doctor at the number below.    Please note that while we do our best to be available for urgent issues outside of office hours, we are not available 24/7.   If you have an urgent issue and are unable to reach [place], you may choose to seek medical care at your doctor's office, retail clinic, urgent care center, or emergency room.  If you have a medical emergency, please immediately  call 911 or go to the emergency department.  Pager Numbers  - Dr. Gwen Pounds: [phone]  - Dr. Neale Burly: [phone]  - Dr. Roseanne Reno: [phone]  In the event of inclement weather, please call our main line at [phone] for an update on the status of any delays or closures.  Dermatology Medication Tips: Please keep the boxes that topical medications come in in order to help keep track of the instructions about where and  how to use these. Pharmacies typically print the medication instructions only on the boxes and not directly on the medication tubes.   If your medication is too expensive, please contact our office at [phone] option 4 or send [place] a message through MyChart.   We are unable to tell what your co-pay for medications will be in advance as this is different depending on your insurance coverage. However, we may be able to find a substitute medication at lower cost or fill out paperwork to get insurance to cover a needed medication.   If a prior authorization is required to get your medication covered by your insurance company, please allow [place] 1-2 business days to complete this process.  Drug prices often vary depending on where the prescription is filled and some pharmacies may offer cheaper prices.  The website www.goodrx.com contains coupons for medications through different pharmacies. The prices here do not account for what the cost may be with help from insurance (it may be cheaper with your insurance), but the website can give you the price if you did not use any insurance.  - You can print the associated coupon and take it with your prescription to the pharmacy.  - You may also stop by our office during regular business hours and pick up a GoodRx coupon card.  - If you need your prescription sent electronically to a different pharmacy, notify our office through [facility] or by phone at [phone] option 4.     Si Usted Necesita Algo Despus de Su Visita  Tambin puede enviarnos un mensaje a travs de Clinical cytogeneticist. Por lo general respondemos a los mensajes de MyChart en el transcurso de 1 a 2 das hbiles.  Para renovar recetas, por favor pida a su farmacia que se ponga en contacto con nuestra oficina. Annie Sable de fax es [place] [phone].  Si tiene un asunto urgente cuando la clnica est cerrada y que no puede esperar hasta el siguiente da hbil, puede llamar/localizar a su  doctor(a) al nmero que aparece a continuacin.   Por favor, tenga en cuenta que aunque hacemos todo lo posible para estar disponibles para asuntos urgentes fuera del horario de [place], no estamos disponibles las 24 horas del da, los 7 [address]  [address] de la [place].   Si tiene un problema urgente y no puede comunicarse con nosotros, puede optar por buscar atencin mdica  en el consultorio de su doctor(a), en una clnica privada, en un centro de atencin urgente o en una sala de emergencias.  Si tiene Engineer, drilling, por favor llame inmediatamente al 911 o vaya a la sala de emergencias.  Nmeros de bper  - Dr. Gwen Pounds: [phone]  - Dra. Moye: [phone]  - Dra. Roseanne Reno: [phone]  En caso de inclemencias del [place], por favor llame a Lacy Duverney principal al [phone] para una actualizacin sobre el [place] de cualquier retraso o cierre.  Consejos para la medicacin en dermatologa: Por favor, guarde las cajas en las que vienen los medicamentos de uso tpico para ayudarle a seguir las instrucciones  sobre dnde y cmo usarlos. Las farmacias generalmente imprimen las instrucciones del medicamento slo en las cajas y no directamente en los tubos del [place].   Si su medicamento es muy caro, por favor, pngase en contacto con Rolm Gala llamando al [phone] y presione la opcin 4 o envenos un mensaje a travs de Clinical cytogeneticist.   No podemos decirle cul ser su copago por los medicamentos por adelantado ya que esto es diferente dependiendo de la cobertura de su seguro. Sin embargo, es posible que podamos encontrar un medicamento sustituto a Audiological scientist un formulario para que el seguro cubra el medicamento que se considera necesario.   Si se requiere una autorizacin previa para que su compaa de seguros [place] su medicamento, por favor permtanos de 1 a 2 das hbiles para completar [address]  Los precios de los medicamentos varan con frecuencia dependiendo del  Environmental consultant de dnde se surte la receta y alguna farmacias pueden ofrecer precios ms baratos.  El sitio web www.goodrx.com tiene cupones para medicamentos de Health and safety inspector. Los precios aqu no tienen en cuenta lo que podra costar con la ayuda del seguro (puede ser ms barato con su seguro), pero el sitio web puede darle el precio si no utiliz Tourist information centre manager.  - Puede imprimir el cupn correspondiente y llevarlo con su receta a la farmacia.  - Tambin puede pasar por nuestra oficina durante el horario de atencin regular y Education officer, museum una tarjeta de cupones de GoodRx.  - Si necesita que su receta se enve electrnicamente a una farmacia diferente, informe a nuestra oficina a travs de MyChart de [place] o por telfono llamando al [phone] y presione la opcin 4.

## 2023-03-23 NOTE — Progress Notes (Signed)
   Follow-Up Visit   Subjective  Nicole Bartlett is a 57 y.o. female who presents for the following: AK follow up. Lesion at right lateral calf treated with LN2. Upper lip treated with 5FU. Thinks has residual AK on lip  The patient has spots, moles and lesions to be evaluated, some may be new or changing and the patient has concerns that these could be cancer.  The following portions of the chart were reviewed this encounter and updated as appropriate: medications, allergies, medical history  Review of Systems:  No other skin or systemic complaints except as noted in HPI or Assessment and Plan.  Objective  Well appearing patient in no apparent distress; mood and affect are within normal limits.  A focused examination was performed of the following areas: Face, right leg  Relevant physical exam findings are noted in the Assessment and Plan.    Assessment & Plan   ACTINIC DAMAGE - chronic, secondary to cumulative UV radiation exposure/sun exposure over time - diffuse scaly erythematous macules with underlying dyspigmentation - Recommend daily broad spectrum sunscreen SPF 30+ to sun-exposed areas, reapply every 2 hours as needed.  - Recommend staying in the shade or wearing long sleeves, sun glasses (UVA+UVB protection) and wide brim hats (4-inch brim around the entire circumference of the hat). - Call for new or changing lesions.  ACTINIC KERATOSIS Exam: very thin scaly pink papule without features suspicious for malignancy on dermoscopy   Actinic keratoses are precancerous spots that appear secondary to cumulative UV radiation exposure/sun exposure over time. They are chronic with expected duration over 1 year. A portion of actinic keratoses will progress to squamous cell carcinoma of the skin. It is not possible to reliably predict which spots will progress to skin cancer and so treatment is recommended to prevent development of skin cancer.  Recommend daily broad spectrum  sunscreen SPF 30+ to sun-exposed areas, reapply every 2 hours as needed.  Recommend staying in the shade or wearing long sleeves, sun glasses (UVA+UVB protection) and wide brim hats (4-inch brim around the entire circumference of the hat). Call for new or changing lesions.  Treatment Plan: Re-Start 5-fluorouracil cream twice a day for 7 days to affected areas including upper cutaneous lip.  Reviewed course of treatment and expected reaction.  Patient advised to expect inflammation and crusting and advised that erosions are possible.  Patient advised to be diligent with sun protection during and after treatment. Handout with details of how to apply medication and what to expect provided. Counseled to keep medication out of reach of children and pets.  Reviewed course of treatment and expected reaction.  Patient advised to expect inflammation and crusting and advised that erosions are possible.  Patient advised to be diligent with sun protection during and after treatment. Handout with details of how to apply medication and what to expect provided. Counseled to keep medication out of reach of children and pets.    Return for TBSE As Scheduled.  I, Lawson Radar, CMA, am acting as scribe for Darden Dates, MD.   Documentation: I have reviewed the above documentation for accuracy and completeness, and I agree with the above.  Darden Dates, MD

## 2023-06-27 ENCOUNTER — Ambulatory Visit (INDEPENDENT_AMBULATORY_CARE_PROVIDER_SITE_OTHER): Payer: Self-pay | Admitting: Physician Assistant

## 2023-06-27 ENCOUNTER — Other Ambulatory Visit: Payer: Self-pay

## 2023-06-27 ENCOUNTER — Encounter: Payer: Self-pay | Admitting: Physician Assistant

## 2023-06-27 VITALS — BP 132/81 | HR 76 | Temp 96.7°F

## 2023-06-27 DIAGNOSIS — J029 Acute pharyngitis, unspecified: Secondary | ICD-10-CM

## 2023-06-27 DIAGNOSIS — J069 Acute upper respiratory infection, unspecified: Secondary | ICD-10-CM

## 2023-06-27 LAB — POC COVID19 BINAXNOW: SARS Coronavirus 2 Ag: NEGATIVE

## 2023-06-27 NOTE — Progress Notes (Signed)
[facility] [address] [place], [place] [address]   Office Visit Note  Patient Name: Nicole Bartlett Date of Birth [id number]  Medical Record number [id number]  Date of Service: 06/27/2023  Chief Complaint  Patient presents with   Sore Throat    Travelled last week, sore throat started Sunday, she states yesterday she slept all day, states she has been feeling feverish      57  y/o F presents to the clinic for c/o post nasal drainage, ear fullness, slight sore throat and feeling feverish x 2 days. No known covid exposure. Denies CP or SOB. Has been taking otc Tylenol and throat logenzes.   Sore Throat  Associated symptoms include congestion. Pertinent negatives include no trouble swallowing.      Current Medication:  Outpatient Encounter Medications as of 06/27/2023  Medication Sig   OXcarbazepine (TRILEPTAL) 150 MG tablet Take 150 mg by mouth 2 (two) times daily.   AIMOVIG 140 MG/ML SOAJ INJECT 140 MG INTO THE SKIN EVERY 28 (TWENTY-EIGHT) DAYS.   buPROPion (WELLBUTRIN XL) 300 MG 24 hr tablet Take 300 mg by mouth daily.   busPIRone (BUSPAR) 10 MG tablet Take 10 mg by mouth 3 (three) times daily.   doxepin (SINEQUAN) 50 MG capsule Take 50 mg by mouth at bedtime.   fluorouracil (EFUDEX) 5 % cream Apply topically 2 (two) times daily. To affected area at upper lip for 7 days   hydrocortisone 2.5 % cream Apply twice daily up to 2 weeks as needed for rash in skin folds   ketoconazole (NIZORAL) 2 % cream APPLY TWICE DAILY TO AFFECTED SKIN FOLDS AS NEEDED FOR RASH   methocarbamol (ROBAXIN) 500 MG tablet Take 500 mg by mouth 4 (four) times daily.    Oxycodone HCl 10 MG TABS Take 10 mg by mouth 3 (three) times daily.    pantoprazole (PROTONIX) 40 MG tablet Take 40 mg by mouth daily as needed (heartburn).    propranolol (INDERAL) 10 MG tablet SMARTSIG:0.5-1 Tablet(s) By Mouth 1-3 Times Daily PRN   REXULTI 2 MG TABS Take 2 mg by mouth daily.    rizatriptan (MAXALT) 10 MG tablet MAY  REPEAT IN 2 HOURS IF NEEDED   XTAMPZA ER 18 MG C12A Take 1 capsule by mouth 2 (two) times daily.   [DISCONTINUED] divalproex (DEPAKOTE ER) 500 MG 24 hr tablet Take 500 mg by mouth daily.  (Patient not taking: Reported on 06/27/2023)   No facility-administered encounter medications on file as of 06/27/2023.      Medical History: Past Medical History:  Diagnosis Date   Anxiety and depression    Chronic neck pain    Fibromyalgia    GERD (gastroesophageal reflux disease)    Headache    migraines   Narcotic dependence (HCC)    Sleep apnea    Tremor      Vital Signs: BP 132/81   Pulse 76   Temp (!) 96.7 F (35.9 C) (Tympanic)   SpO2 96%    Review of Systems  Constitutional: Negative.   HENT:  Positive for congestion, postnasal drip and sore throat. Negative for sinus pressure, sinus pain and trouble swallowing.   Eyes: Negative.   Respiratory: Negative.    Cardiovascular: Negative.   Neurological: Negative.     Physical Exam Constitutional:      Appearance: Normal appearance.  HENT:     Head: Atraumatic.     Right Ear: Tympanic membrane, ear canal and external ear normal.  Left Ear: Tympanic membrane, ear canal and external ear normal.     Nose: Nose normal.     Mouth/Throat:     Mouth: Mucous membranes are moist.     Pharynx: Oropharynx is clear.  Eyes:     Extraocular Movements: Extraocular movements intact.  Cardiovascular:     Rate and Rhythm: Normal rate and regular rhythm.  Pulmonary:     Effort: Pulmonary effort is normal.     Breath sounds: Normal breath sounds.  Musculoskeletal:     Cervical back: Neck supple.  Skin:    General: Skin is warm.  Neurological:     Mental Status: She is alert.  Psychiatric:        Mood and Affect: Mood normal.        Behavior: Behavior normal.        Thought Content: Thought content normal.        Judgment: Judgment normal.       Assessment/Plan:  1. Sore throat - POC COVID-19  2. Viral upper respiratory  tract infection  Reviewed negative rapid covid test result. Increase fluids Take otc nasal or oral decongestant Continue with throat logenzes Continue to watch for worsening symptoms. RTC prn Pt verbalized understanding and in agreement.    General Counseling: Milarose verbalizes understanding of the findings of todays visit and agrees with plan of treatment. I have discussed any further diagnostic evaluation that may be needed or ordered today. We also reviewed her medications today. she has been encouraged to call the office with any questions or concerns that should arise related to todays visit.    Time spent:20 Minutes    Gilberto Better, [place] Physician Assistant

## 2023-07-26 ENCOUNTER — Encounter: Payer: Self-pay | Admitting: Adult Health

## 2023-07-26 ENCOUNTER — Ambulatory Visit (INDEPENDENT_AMBULATORY_CARE_PROVIDER_SITE_OTHER): Payer: Self-pay | Admitting: Adult Health

## 2023-07-26 ENCOUNTER — Other Ambulatory Visit: Payer: Self-pay

## 2023-07-26 VITALS — BP 120/90 | HR 90 | Temp 96.9°F | Ht 64.0 in | Wt 240.0 lb

## 2023-07-26 DIAGNOSIS — J3489 Other specified disorders of nose and nasal sinuses: Secondary | ICD-10-CM

## 2023-07-26 NOTE — Progress Notes (Signed)
[facility] [address] [place], [place] [address]   Office Visit Note  Patient Name: Nicole Bartlett Date of Birth [id number]  Medical Record number [id number]  Date of Service: 07/26/2023  Chief Complaint  Patient presents with   Acute Visit    Patient c/o L ear pain x 5 days. She states her throat slightly hurts this morning. Does not have pain in upper teeth. Denies fever.      HPI Pt is here for a sick visit. Pt reports left ear pain for 4 days.  She also describes that her teeth didn't "sit right".  The pain seems to be localized to the ear, but not the teeth.  She has had some headache, fever, chills or cough.  She took tylenol with some relief.    Current Medication:  Outpatient Encounter Medications as of 07/26/2023  Medication Sig   AIMOVIG 140 MG/ML SOAJ INJECT 140 MG INTO THE SKIN EVERY 28 (TWENTY-EIGHT) DAYS.   buPROPion (WELLBUTRIN XL) 300 MG 24 hr tablet Take 300 mg by mouth daily.   busPIRone (BUSPAR) 10 MG tablet Take 10 mg by mouth 3 (three) times daily as needed.   diclofenac Sodium (VOLTAREN) 1 % GEL Apply 2 g topically 4 (four) times daily as needed.   doxepin (SINEQUAN) 50 MG capsule Take 50 mg by mouth at bedtime.   fluorouracil (EFUDEX) 5 % cream Apply topically 2 (two) times daily. To affected area at upper lip for 7 days   hydrocortisone 2.5 % cream Apply twice daily up to 2 weeks as needed for rash in skin folds   ketoconazole (NIZORAL) 2 % cream APPLY TWICE DAILY TO AFFECTED SKIN FOLDS AS NEEDED FOR RASH   methocarbamol (ROBAXIN) 500 MG tablet Take 500 mg by mouth 4 (four) times daily.    OXcarbazepine (TRILEPTAL) 150 MG tablet Take 150 mg by mouth 2 (two) times daily.   Oxycodone HCl 10 MG TABS Take 10 mg by mouth 3 (three) times daily.    pantoprazole (PROTONIX) 40 MG tablet Take 40 mg by mouth daily as needed (heartburn).    propranolol (INDERAL) 10 MG tablet SMARTSIG:0.5-1 Tablet(s) By Mouth 1-3 Times Daily PRN   REXULTI 2 MG TABS Take 2 mg  by mouth daily.    UBRELVY 100 MG TABS Take 1 tablet by mouth daily as needed.   XTAMPZA ER 18 MG C12A Take 1 capsule by mouth 2 (two) times daily.   [DISCONTINUED] rizatriptan (MAXALT) 10 MG tablet MAY REPEAT IN 2 HOURS IF NEEDED   No facility-administered encounter medications on file as of 07/26/2023.      Medical History: Past Medical History:  Diagnosis Date   Anxiety and depression    Chronic neck pain    Fibromyalgia    GERD (gastroesophageal reflux disease)    Headache    migraines   Narcotic dependence (HCC)    Sleep apnea    Tremor      Vital Signs: BP (!) 120/90   Pulse 90   Temp (!) 96.9 F (36.1 C)   Ht 5\' 4"  (1.626 m)   Wt 240 lb (108.9 kg)   SpO2 99%   BMI 41.20 kg/m    Review of Systems  Constitutional:  Positive for chills and fever.  HENT:  Positive for ear pain.   Respiratory:  Positive for cough.   Gastrointestinal:  Negative for diarrhea, nausea and vomiting.  Neurological:  Positive for headaches.    Physical Exam Vitals reviewed.  Constitutional:      Appearance: Normal appearance.  HENT:     Head: Normocephalic.     Right Ear: Tympanic membrane and ear canal normal.     Left Ear: Tympanic membrane and ear canal normal.     Nose: Nose normal.     Mouth/Throat:     Mouth: Mucous membranes are moist.  Pulmonary:     Effort: Pulmonary effort is normal.  Lymphadenopathy:     Cervical: No cervical adenopathy.  Neurological:     Mental Status: She is alert.     Assessment/Plan: 1. Sinus pressure Take decongestant as discussed. Also instructed patient to use Flonase, Two sprays in each nostril twice daily. Follow up via MyChart messenger if symptoms fail to improve or may return to clinic as needed for worsening symptoms.       General Counseling: Nicole Bartlett verbalizes understanding of the findings of todays visit and agrees with plan of treatment. I have discussed any further diagnostic evaluation that may be needed or ordered today. We  also reviewed her medications today. she has been encouraged to call the office with any questions or concerns that should arise related to todays visit.   No orders of the defined types were placed in this encounter.   No orders of the defined types were placed in this encounter.   Time spent:15 Minutes    Johnna Acosta AGNP-C Nurse Practitioner

## 2023-08-01 NOTE — Progress Notes (Unsigned)
PATIENT: Nicole Bartlett DOB: [birth year]  REASON FOR VISIT: follow up for migraines  HISTORY FROM: patient PRIMARY NEUROLOGIST: Willis/Chima  HISTORY OF PRESENT ILLNESS: Today 08/02/23  Remains on Aimovig 140 mg, on average 2 migraines in 30 days. Her pain management filled her Aimovig, thinks they had to get PA. 5 mild to moderate headaches. Uses Tylenol. Stopped the Maxalt, uses ubrelvy with good benefit from pain management. Few more headaches due to weather change, stress, end of year taxes at [employer]. Is off Depakote (due to weight gain) for mood, switched to Trileptal for mental health.   Update 07/28/22 SS: [name] is here today for follow-up. Remains on Aimovig 140 mg, no side effect. Has about 2-3 migraines a month. Maxalt usually helps, if it doesn't excedrin migraine is a back up. No health issues. Seeing psychiatry. Still working at [employer], her granddaughter just started there.  No new issues or concerns.  Update 04/20/21 SS: [name] is a 57 year old female with history of headaches.  Doing well on Aimovig 140 mg monthly injection, denies side effect.  Is on Depakote from her psychiatrist.  Takes Maxalt with good benefit as needed.  Has OSA, but does not use BiPAP.  On average, reports 2 migraines a month, will start with Tylenol, if not helpful will proceed with Maxalt, usually only takes Maxalt once a month.  Had right Achilles tendon repair in December 2021.  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  Overall health has been good.  Here today for evaluation unaccompanied.  Is pleased with her headache control.  Update 04/16/2020 SS: [name] is a 57 year old female with history of headaches and OSA, does not use BiPAP.  She remains on Aimovig 140 mg monthly injection, she has come off Topamax.  Headaches remain well controlled.  She may have 2 migraines a month, does have fairly frequent " regular headache" easily taken care of with Tylenol.  Sees her psychiatrist  monthly, is on Depakote.  Migraines respond well to Maxalt, usually takes twice a month with good benefit.  Denies side effects of medications.  She works as an Airline pilot.  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  She works as an Airline pilot at [employer]. Having some right Achilles tendon issues.  Presents today for evaluation unaccompanied.  HISTORY  08/13/2019 SS: [name] is a 57 year old female with history of headaches and obstructive sleep apnea.  She does not use her BiPAP.  She remains on Topamax 25 mg in the morning, 50 mg in the evening. Higher doses have caused her to have difficulty focusing. She is also taking Depakote, prescribed by her psychiatrist.  She takes Maxalt as needed for headache. She had right shoulder replacement surgery in August 2020.  She is also taking Aimovig prescribed by her psychiatrist for migraines.  She reports her headaches are doing well.  She received 3-4 Aimovig injections, but missed the injection last month.  She has remained on Topamax.  She says with Aimovig she has had an 80% reduction in headaches.  She has not had to take Maxalt in 2 months.  She reports her headaches are triggered by sun sensitivity, driving at night when the sun is going down.  She indicates her health has been good.  She works full-time as an Airline pilot at [employer].  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  Her psychiatrist is at the [facility]. She does have tremor to bilateral hands and head titubation  for several years. She reports family history of same.  She has a follow-up unaccompanied.  REVIEW OF SYSTEMS: Out of a complete 14 system review of symptoms, the patient complains only of the following symptoms, and all other reviewed systems are negative.  See HPI  ALLERGIES: Allergies  Allergen Reactions   Sulfa Antibiotics Itching and Rash    angioedema    Ibuprofen Other (See Comments)    Hx of stomach ulcers    HOME MEDICATIONS: Outpatient  Medications Prior to Visit  Medication Sig Dispense Refill   buPROPion (WELLBUTRIN XL) 300 MG 24 hr tablet Take 300 mg by mouth daily.     busPIRone (BUSPAR) 10 MG tablet Take 10 mg by mouth 3 (three) times daily as needed.     diclofenac Sodium (VOLTAREN) 1 % GEL Apply 2 g topically 4 (four) times daily as needed.     doxepin (SINEQUAN) 50 MG capsule Take 50 mg by mouth at bedtime.     ketoconazole (NIZORAL) 2 % cream APPLY TWICE DAILY TO AFFECTED SKIN FOLDS AS NEEDED FOR RASH 60 g 2   methocarbamol (ROBAXIN) 500 MG tablet Take 500 mg by mouth 4 (four) times daily.      OXcarbazepine (TRILEPTAL) 150 MG tablet Take 150 mg by mouth 2 (two) times daily.     Oxycodone HCl 10 MG TABS Take 10 mg by mouth 3 (three) times daily.      pantoprazole (PROTONIX) 40 MG tablet Take 40 mg by mouth daily as needed (heartburn).   5   propranolol (INDERAL) 10 MG tablet SMARTSIG:0.5-1 Tablet(s) By Mouth 1-3 Times Daily PRN     REXULTI 2 MG TABS Take 2 mg by mouth daily.      XTAMPZA ER 18 MG C12A Take 1 capsule by mouth 2 (two) times daily.     AIMOVIG 140 MG/ML SOAJ INJECT 140 MG INTO THE SKIN EVERY 28 (TWENTY-EIGHT) DAYS. 1 mL 11   UBRELVY 100 MG TABS Take 1 tablet by mouth daily as needed.     fluorouracil (EFUDEX) 5 % cream Apply topically 2 (two) times daily. To affected area at upper lip for 7 days 40 g 0   hydrocortisone 2.5 % cream Apply twice daily up to 2 weeks as needed for rash in skin folds 28 g 2   No facility-administered medications prior to visit.    PAST MEDICAL HISTORY: Past Medical History:  Diagnosis Date   Anxiety and depression    Chronic neck pain    Fibromyalgia    GERD (gastroesophageal reflux disease)    Headache    migraines   Narcotic dependence (HCC)    Sleep apnea    Tremor     PAST SURGICAL HISTORY: Past Surgical History:  Procedure Laterality Date   C5/C6 fusion  2010   C5/C7 fusion  2002   CESAREAN SECTION     times 2   GALLBLADDER SURGERY     REVERSE  SHOULDER ARTHROPLASTY Right 06/28/2019   Procedure: REVERSE SHOULDER ARTHROPLASTY;  Surgeon: Beverely Low, MD;  Location: [facility];  Service: Orthopedics;  Laterality: Right;  with interscalene block   ROTATOR CUFF REPAIR Bilateral     FAMILY HISTORY: Family History  Problem Relation Age of Onset   Cancer Mother    Dementia Mother    Diabetes Father    Migraines Sister    Fibroids Daughter     SOCIAL HISTORY: Social History   Socioeconomic History   Marital status: Divorced    Spouse  name: Not on file   Number of children: 2   Years of education: Not on file   Highest education level: Bachelor's degree (e.g., BA, AB, BS)  Occupational History   Occupation: Social worker: [employer]  Tobacco Use   Smoking status: Every Day    Current packs/day: 0.25    Average packs/day: 0.3 packs/day for 15.0 years (3.8 ttl pk-yrs)    Types: Cigarettes    Passive exposure: Never   Smokeless tobacco: Never  Vaping Use   Vaping status: Former   Start date: 06/10/2019   Substances: Nicotine, CBD, Flavoring  Substance and Sexual Activity   Alcohol use: Yes    Alcohol/week: 1.0 standard drink of alcohol    Types: 1 Standard drinks or equivalent per week    Comment: rare   Drug use: No   Sexual activity: Not on file  Other Topics Concern   Not on file  Social History Narrative   Patient is separated.   Patient has 2 children   Patient lives at home with daughter.   Patient drinks caffeine everyday until 5pm.         Social Determinants of Health   Financial Resource Strain: Low Risk  (08/01/2022)   Received from [facility], [facility]   Overall Financial Resource Strain (CARDIA)    Difficulty of Paying Living Expenses: Not very hard  Food Insecurity: Food Insecurity Present (08/01/2022)   [facility] from [facility], [facility]   Hunger Vital Sign    Worried About Running Out of Food in the Last Year: Sometimes true    Ran Out of Food in the Last Year: Never true   Transportation Needs: Not on file  Physical Activity: Inactive (08/01/2022)   Received from [facility], [facility]   Exercise Vital Sign    Days of Exercise per Week: 0 days    Minutes of Exercise per Session: 0 min  Stress: No Stress Concern Present (08/01/2022)   Received from [facility], [facility] of Occupational Health - Occupational Stress Questionnaire    Feeling of Stress : Only a little  Social Connections: Somewhat Isolated (08/01/2022)   Received from [facility], [facility]   Social Network    How would you rate your social network (family, work, friends)?: Restricted participation with some degree of social isolation  Intimate Partner Violence: Not At Risk (08/01/2022)   Received from [facility], [facility]   HITS    Over the last 12 months how often did your partner physically hurt you?: 1    Over the last 12 months how often did your partner insult you or talk down to you?: 1    Over the last 12 months how often did your partner threaten you with physical harm?: 1    Over the last 12 months how often did your partner scream or curse at you?: 1   PHYSICAL EXAM  Vitals:   08/02/23 0732  BP: 125/87  Pulse: 74  Resp: 17  Weight: 250 lb (113.4 kg)  Height: 5' 4.5" (1.638 m)    Body mass index is 42.25 kg/m.  Generalized: Well developed, in no acute distress   Neurological examination  Mentation: Alert oriented to time, place, history taking. Follows all commands speech and language fluent Cranial nerve II-XII: Pupils were equal round reactive to light. Extraocular movements were full, visual field were full on confrontational test. Facial sensation and strength were normal. Head turning and  shoulder shrug were normal and symmetric. Motor: The motor testing reveals 5 over 5 strength of all 4 extremities. Good symmetric motor tone is noted throughout.  Sensory: Sensory testing is intact to soft touch on all 4 extremities. No  evidence of extinction is noted.  Coordination: Cerebellar testing reveals good finger-nose-finger and heel-to-shin bilaterally.  Gait and station: Gait is normal.  Reflexes: Deep tendon reflexes are symmetric and normal bilaterally.   DIAGNOSTIC DATA (LABS, IMAGING, TESTING) - I reviewed patient records, labs, notes, testing and imaging myself where available.  Lab Results  Component Value Date   WBC 5.7 06/26/2019   HGB 10.4 (L) 06/29/2019   HCT 34.0 (L) 06/29/2019   MCV 95.7 06/26/2019   PLT 233 06/26/2019      Component Value Date/Time   NA 135 06/29/2019 0305   K 4.2 06/29/2019 0305   CL 104 06/29/2019 0305   CO2 22 06/29/2019 0305   GLUCOSE 153 (H) 06/29/2019 0305   BUN 10 06/29/2019 0305   CREATININE 0.66 06/29/2019 0305   CALCIUM 8.2 (L) 06/29/2019 0305   GFRNONAA >60 06/29/2019 0305   GFRAA >60 06/29/2019 0305   No results found for: "CHOL", "HDL", "LDLCALC", "LDLDIRECT", "TRIG", "CHOLHDL" No results found for: "HGBA1C" No results found for: "VITAMINB12" No results found for: "TSH"  ASSESSMENT AND PLAN 57 y.o. year old female  has a past medical history of Anxiety and depression, Chronic neck pain, Fibromyalgia, GERD (gastroesophageal reflux disease), Headache, Narcotic dependence (HCC), Sleep apnea, and Tremor. here with:  1.  Chronic migraine headache  -Few more headaches lately, potentially due to coming off Depakote (caused weight gain), now on Trileptal.  Overall, pleased with the regimen for migraines -Continue Aimovig 140 mg monthly for migraine prevention -Continue Ubrelvy 100 mg as needed for acute headache -Previously tried and failed: Topamax, Maxalt, Depakote, propranolol; currently taking: Wellbutrin, BuSpar, Rexulti, doxepin, Trileptal, oxycodone -Next steps: Ajovy or Emgality, Qulipta -Follow-up with me in 1 year or sooner if needed virtually  Margie Ege, AGNP-C, DNP 08/02/2023, 7:59 AM [facility] [address], [address] [place], [place] [address] [phone]

## 2023-08-02 ENCOUNTER — Ambulatory Visit: Payer: BC Managed Care – PPO | Admitting: Neurology

## 2023-08-02 ENCOUNTER — Encounter: Payer: Self-pay | Admitting: Neurology

## 2023-08-02 VITALS — BP 125/87 | HR 74 | Resp 17 | Ht 64.5 in | Wt 250.0 lb

## 2023-08-02 DIAGNOSIS — G43709 Chronic migraine without aura, not intractable, without status migrainosus: Secondary | ICD-10-CM

## 2023-08-02 MED ORDER — UBRELVY 100 MG PO TABS: 1.0000 | ORAL_TABLET | Freq: Every day | ORAL | 11 refills | Status: DC | PRN

## 2023-08-02 MED ORDER — AIMOVIG 140 MG/ML ~~LOC~~ SOAJ: 140.0000 mg | SUBCUTANEOUS | 11 refills | Status: DC

## 2023-08-02 NOTE — Patient Instructions (Signed)
Great to see you today.  We will continue current medications.  Please let me know if your headaches increase. See you back in 1 year. Thanks!

## 2024-01-23 ENCOUNTER — Encounter: Payer: BC Managed Care – PPO | Admitting: Dermatology

## 2024-01-25 ENCOUNTER — Encounter: Payer: BC Managed Care – PPO | Admitting: Dermatology

## 2024-02-08 ENCOUNTER — Ambulatory Visit: Admitting: Podiatry

## 2024-02-08 ENCOUNTER — Other Ambulatory Visit: Payer: Self-pay

## 2024-02-08 ENCOUNTER — Ambulatory Visit

## 2024-02-08 ENCOUNTER — Encounter: Payer: Self-pay | Admitting: Podiatry

## 2024-02-08 DIAGNOSIS — M7751 Other enthesopathy of right foot: Secondary | ICD-10-CM

## 2024-02-08 DIAGNOSIS — L03115 Cellulitis of right lower limb: Secondary | ICD-10-CM | POA: Diagnosis not present

## 2024-02-08 NOTE — Progress Notes (Signed)
  Subjective:  Patient ID: Nicole Bartlett, female    DOB: February 24, 1966,   MRN: [id number]  No chief complaint on file.   58 y.o. female presents for concern of swollen painful right great toe. Relates this started last week. She relates she regularly takes pain medicine and this was enough to have her call in due to the concern for pain. Relates it has improved recently and not as painful and swollen. She does have a family history of gout.  . Denies any other pedal complaints. Denies n/v/f/c.   Past Medical History:  Diagnosis Date   Anxiety and depression    Chronic neck pain    Fibromyalgia    GERD (gastroesophageal reflux disease)    Headache    migraines   Narcotic dependence (HCC)    Sleep apnea    Tremor     Objective:  Physical Exam: Vascular: DP/PT pulses 2/4 bilateral. CFT <3 seconds. Normal hair growth on digits. No edema.  Skin. No lacerations or abrasions bilateral feet.  Musculoskeletal: MMT 5/5 bilateral lower extremities in DF, PF, Inversion and Eversion. Deceased ROM in DF of ankle joint. Tender to the hallux IPJ and mild pain with ROM of the digit. Minimal edema or ertyhema noted currently. Hyperkeratosis noted to distal medial border of right hallux nail.  Neurological: Sensation intact to light touch.   Assessment:   1. Capsulitis of toe of right foot      Plan:  Patient was evaluated and treated and all questions answered. -Xrays reviewed. No acute fractures or dislocations noted.  -Discussed treatement options for hallux arthritis vs gouty arthritis and gout education provided. -Discussed diet and modifications.  -Continue Voltaren gel unable to take NSAIDs.  -Discussed shoe gear modficiation and stiff soled shoes.  -Discussed in the future potential injection if needed.  -Patient to return as needed   Louann Sjogren, DPM

## 2024-03-04 ENCOUNTER — Ambulatory Visit (INDEPENDENT_AMBULATORY_CARE_PROVIDER_SITE_OTHER)

## 2024-03-04 ENCOUNTER — Ambulatory Visit: Admitting: Podiatry

## 2024-03-04 DIAGNOSIS — T148XXA Other injury of unspecified body region, initial encounter: Secondary | ICD-10-CM | POA: Diagnosis not present

## 2024-03-04 DIAGNOSIS — S9032XS Contusion of left foot, sequela: Secondary | ICD-10-CM | POA: Diagnosis not present

## 2024-03-04 DIAGNOSIS — M79672 Pain in left foot: Secondary | ICD-10-CM | POA: Diagnosis not present

## 2024-03-04 DIAGNOSIS — M7752 Other enthesopathy of left foot: Secondary | ICD-10-CM | POA: Diagnosis not present

## 2024-03-04 NOTE — Progress Notes (Signed)
  Subjective:  Patient ID: Nicole Bartlett, female    DOB: 09-09-1966,   MRN: [id number]  No chief complaint on file.   58 y.o. female presents for concern of left foot injury that occurred yesterday at 1 in the morning. Relates she tripped on her sweat pants and landed on her foot. Relates immediate pain. Has been improving though and able to get around. Seen by urgent care and initially told no fracture but radiology read possible cuboid avulsion . Denies any other pedal complaints. Denies n/v/f/c.   Past Medical History:  Diagnosis Date   Anxiety and depression    Chronic neck pain    Fibromyalgia    GERD (gastroesophageal reflux disease)    Headache    migraines   Narcotic dependence (HCC)    Sleep apnea    Tremor     Objective:  Physical Exam: Vascular: DP/PT pulses 2/4 bilateral. CFT <3 seconds. Normal hair growth on digits. No edema.  Skin. No lacerations or abrasions bilateral feet.  Musculoskeletal: MMT 5/5 bilateral lower extremities in DF, PF, Inversion and Eversion. Deceased ROM in DF of ankle joint.  Tender along fifth metatarsal base. No pain along cuboid area. Some pain more proximal along peroneal tendon. Mild edema noted Neurological: Sensation intact to light touch.   Assessment:   1. Contusion of soft tissue      Plan:  Patient was evaluated and treated and all questions answered. -Xrays reviewed. No acute fractures or dislocations noted -Discussed treatement options for contusion of the foot; risks, alternatives, and benefits explained. -Dispensed CAM boot  Patient to wear at all times and instructed on use -Recommend protection, rest, ice, elevation daily until symptoms improve -Rx pain med/anti-inflammatories as needed -Patient to return to office in 4 weeks for serial x-rays to assess healing  or sooner if condition worsens.   Jennefer Moats, DPM

## 2024-04-18 ENCOUNTER — Encounter: Payer: Self-pay | Admitting: Podiatry

## 2024-04-18 ENCOUNTER — Ambulatory Visit: Admitting: Podiatry

## 2024-04-18 DIAGNOSIS — T148XXA Other injury of unspecified body region, initial encounter: Secondary | ICD-10-CM | POA: Diagnosis not present

## 2024-04-18 NOTE — Progress Notes (Signed)
  Subjective:  Patient ID: Nicole Bartlett, female    DOB: [birth year],   MRN: [id number]  Chief Complaint  Patient presents with   Foot Pain    "It's improving."    58 y.o. female presents for follow-up of left foot injury. Relates it is doing better and did stop wearing the boot. She does relate more twinging type pain up the back and side of the ankle now. . Denies any other pedal complaints. Denies n/v/f/c.   Past Medical History:  Diagnosis Date   Anxiety and depression    Chronic neck pain    Fibromyalgia    GERD (gastroesophageal reflux disease)    Headache    migraines   Narcotic dependence (HCC)    Sleep apnea    Tremor     Objective:  Physical Exam: Vascular: DP/PT pulses 2/4 bilateral. CFT <3 seconds. Normal hair growth on digits. No edema.  Skin. No lacerations or abrasions bilateral feet.  Musculoskeletal: MMT 5/5 bilateral lower extremities in DF, PF, Inversion and Eversion. Deceased ROM in DF of ankle joint.  Non tender along fifth metatarsal base. No pain along cuboid area. Some pain more proximal along peroneal tendon. Mild edema noted Neurological: Sensation intact to light touch.   Assessment:   1. Contusion of soft tissue       Plan:  Patient was evaluated and treated and all questions answered. -Xrays reviewed. No acute fractures or dislocations noted -Discussed treatement options for contusion of the foot; risks, alternatives, and benefits explained. -May discontinue CAM boot.  -Recommend protection, rest, ice, elevation daily until symptoms improve -Rx pain med/anti-inflammatories as needed -Patient to return to office as needed Jennefer Moats, DPM

## 2024-04-18 NOTE — Patient Instructions (Signed)
 Peroneal Tendinopathy Rehab Ask your health care provider which exercises are safe for you. Do exercises exactly as told by your health care provider and adjust them as directed. It is normal to feel mild stretching, pulling, tightness, or discomfort as you do these exercises. Stop right away if you feel sudden pain or your pain gets worse. Do not begin these exercises until told by your health care provider. Stretching and range-of-motion exercises These exercises warm up your muscles and joints. They can help improve the movement and flexibility of your ankle. They may also help to relieve pain and stiffness. Gastrocnemius and soleus stretch, standing This is an exercise in which you stand on a step and use your body weight to stretch your calf muscles. To do this exercise: Stand on the edge of a step on the ball of your left / right foot. The ball of your foot is on the walking surface, right under your toes. Keep your other foot firmly on the same step. Hold on to the wall, a railing, or a chair for balance. Slowly lift your other foot, allowing your body weight to press your left / right heel down over the edge of the step. You should feel a stretch in your left / right calf (gastrocnemius and soleus). Hold this position for __________ seconds. Return both feet to the step. Repeat this exercise with a slight bend in your left / right knee. Repeat __________ times with your left / right knee straight and __________ times with your left / right knee bent. Complete this exercise __________ times a day. Strengthening exercises These exercises build strength and endurance in your foot and ankle. Endurance is the ability to use your muscles for a long time, even after they get tired. Ankle dorsiflexion with band  Secure a rubber exercise band or tube to an object, such as a table leg, that will not move when the band is pulled. Secure the other end of the band around your left / right foot. Sit on  the floor. Face the object with your left / right leg extended. The band or tube should be slightly tense when your foot is relaxed. Slowly flex your left / right ankle and toes to bring your foot toward you (dorsiflexion). Hold this position for __________ seconds. Let the band or tube slowly pull your foot back to the starting position. Repeat __________ times. Complete this exercise __________ times a day. Ankle eversion  Sit on the floor with your legs straight out in front of you. Loop a rubber exercise band or tube around the ball of your left / right foot. The ball of your foot is on the walking surface, right under your toes. Hold the ends of the band in your hands. You can also secure the band to a stable object. The band or tube should be slightly tense when your foot is relaxed. Slowly push your foot outward, away from your other leg (eversion). Hold this position for __________ seconds. Slowly return your foot to the starting position. Repeat __________ times. Complete this exercise __________ times a day. Plantar flexion, standing This exercise is sometimes called a standing heel raise. Stand with your feet shoulder-width apart. Place your hands on a wall or table to steady yourself as needed. Try not to use it for support. Keep your weight spread evenly over the width of your feet while you slowly rise up on your toes (plantar flexion). If told by your health care provider: Shift your weight  toward your left / right leg until you feel challenged. Stand on your left / right leg only. Hold this position for __________ seconds. Repeat __________ times. Complete this exercise __________ times a day. Single leg stand  Without shoes, stand near a railing or in a doorway. You may hold on to the railing or doorframe as needed. Stand on your left / right foot. Keep your big toe down on the floor and try to keep your arch lifted. Do not roll to the outside of your foot. If this  exercise is too easy, you can try it with your eyes closed or while standing on a pillow. Hold this position for __________ seconds. Repeat __________ times. Complete this exercise __________ times a day. This information is not intended to replace advice given to you by your health care provider. Make sure you discuss any questions you have with your health care provider. Document Revised: 03/03/2022 Document Reviewed: 03/03/2022 Elsevier Patient Education  2024 [employer].

## 2024-08-02 ENCOUNTER — Ambulatory Visit

## 2024-08-02 ENCOUNTER — Ambulatory Visit
Admission: EM | Admit: 2024-08-02 | Discharge: 2024-08-02 | Discharge status: Against Medical Advice | Attending: Family Medicine | Admitting: Family Medicine

## 2024-08-02 ENCOUNTER — Other Ambulatory Visit: Payer: Self-pay

## 2024-08-02 DIAGNOSIS — R0902 Hypoxemia: Secondary | ICD-10-CM

## 2024-08-02 DIAGNOSIS — J189 Pneumonia, unspecified organism: Secondary | ICD-10-CM | POA: Diagnosis not present

## 2024-08-02 DIAGNOSIS — J069 Acute upper respiratory infection, unspecified: Secondary | ICD-10-CM | POA: Diagnosis not present

## 2024-08-02 LAB — POC SARS CORONAVIRUS 2 AG -  ED: SARS Coronavirus 2 Ag: NEGATIVE

## 2024-08-02 LAB — POCT INFLUENZA A/B
Influenza A, POC: NEGATIVE
Influenza B, POC: NEGATIVE

## 2024-08-02 MED ORDER — IPRATROPIUM-ALBUTEROL 0.5-2.5 (3) MG/3ML IN SOLN
3.0000 mL | Freq: Once | RESPIRATORY_TRACT | Status: AC
Start: 2024-08-02 — End: 2024-08-02
  Administered 2024-08-02: 3 mL via RESPIRATORY_TRACT

## 2024-08-02 NOTE — ED Notes (Signed)
 Sp02 88-89% on RA after neb tx. Pt placed on oxygen per [place] at 2 L/min. Sp02 back up to 94% on oxygen. Dr. Pauline notified.

## 2024-08-02 NOTE — ED Notes (Addendum)
 Sp02 up to 94% on oxygen @ 2L/min per [place]. Dr. Pauline notified.

## 2024-08-02 NOTE — ED Notes (Signed)
 Patient is being discharged from the Urgent Care and sent to the Emergency Department via POV driven by daughter. Per Dr Pauline, patient is in need of higher level of care due to shortness of breath and hypoxia. Patient is aware and verbalizes understanding of plan of care.  Vitals:   08/02/24 0906 08/02/24 0911  BP: 113/76   Pulse: (!) 104   Resp: (!) 26   Temp: 98.2 F (36.8 C)   SpO2: (!) 89% 94%

## 2024-08-02 NOTE — ED Notes (Signed)
 Pt signed AMA form, sts she has to leave and get her car from her daughter, refuses to go to ER at this time via ambulance. Sts she will have daughter drive her.

## 2024-08-02 NOTE — ED Triage Notes (Addendum)
 Sick 8-10 days, has c/o cough productive of light yellow to creamy white sputum, has spots of blood in it. Has been having coughing fits. Has been short of breath. Mild fever. Aches and pains, no energy. Yesterday noted swelling to face on right side, then the left side started swelling. Feels stuffy in right ear. Has had allergy medicine (benadryl), robitussin for cough. Sp02 89-90% on RA, tachypneic. Placed on oxygen per [place] at 2L/min and notified Dr. Pauline.

## 2024-08-02 NOTE — ED Provider Notes (Signed)
 Nicole Bartlett CARE    CSN: [id number] Arrival date & time: 08/02/24  0843      History   Chief Complaint Chief Complaint  Patient presents with   Shortness of Breath   Cough   Fever    HPI Nicole Bartlett is a 58 y.o. female.   HPI  Past Medical History:  Diagnosis Date   Anxiety and depression    Chronic neck pain    Fibromyalgia    GERD (gastroesophageal reflux disease)    Headache    migraines   Narcotic dependence (HCC)    Sleep apnea    Tremor     Patient Active Problem List   Diagnosis Date Noted   Chronic migraine w/o aura w/o status migrainosus, not intractable 07/28/2022   Low grade squamous intraepithelial lesion (LGSIL) on cervicovaginal cytologic smear 04/28/2020   Postmenopausal bleeding 04/28/2020   Pain in left knee 07/25/2019   Depressive disorder 07/10/2019   S/P shoulder replacement, 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   Noncompliance with CPAP treatment 01/15/2019   Synovitis 12/25/2018   Pain of left hand 11/06/2018   Trigger finger 09/26/2018   Carpal tunnel syndrome 08/29/2018   GERD (gastroesophageal reflux disease) 11/22/2017   Drug-induced constipation 07/05/2017   Nasal septal perforation 02/26/2017   OSA (obstructive sleep apnea) 01/16/2017   Prediabetes 07/15/2016   Dyslipidemia 06/21/2016   High risk medication use 06/21/2016   Vitamin D deficiency 06/21/2016   Migraine 12/29/2015   Fatigue 02/06/2015   Difficulty staying awake 02/06/2015   Adiposity 02/06/2015   Snores 02/06/2015   Current tobacco use 02/06/2015   Chronic pain associated with significant psychosocial dysfunction 09/22/2014   Depression, major, recurrent (HCC) 09/22/2014   Intractable migraine without aura 03/06/2014   Headache 03/06/2014   Absence of menstruation 08/15/2012    Past Surgical History:  Procedure Laterality Date   C5/C6 fusion  2010   C5/C7 fusion  2002   CESAREAN SECTION     times 2   GALLBLADDER SURGERY     REVERSE SHOULDER ARTHROPLASTY Right  06/28/2019   Procedure: REVERSE SHOULDER ARTHROPLASTY;  Surgeon: Kay Kemps, MD;  Location: [facility];  Service: Orthopedics;  Laterality: Right;  with interscalene block   ROTATOR CUFF REPAIR Bilateral     OB History   No obstetric history on file.      Home Medications    Prior to Admission medications   Medication Sig Start Date End Date Taking? Authorizing Provider  buPROPion  (WELLBUTRIN  XL) 300 MG 24 hr tablet Take 300 mg by mouth daily.    [provider]  busPIRone (BUSPAR) 10 MG tablet Take 10 mg by mouth 3 (three) times daily as needed. 12/17/21   [provider]  diclofenac  Sodium (VOLTAREN ) 1 % GEL Apply 2 g topically 4 (four) times daily as needed. 02/27/23   [provider]  doxepin  (SINEQUAN ) 50 MG capsule Take 50 mg by mouth at bedtime.    [provider]  Erenumab -aooe (AIMOVIG ) 140 MG/ML SOAJ Inject 140 mg into the skin every 28 (twenty-eight) days. 08/02/23   Gayland Lauraine PARAS, NP  ketoconazole  (NIZORAL ) 2 % cream APPLY TWICE DAILY TO AFFECTED SKIN FOLDS AS NEEDED FOR RASH 07/18/22   Moye, Virginia , MD  methocarbamol  (ROBAXIN ) 500 MG tablet Take 500 mg by mouth 4 (four) times daily.     [provider]  OXcarbazepine (TRILEPTAL) 150 MG tablet Take 150 mg by mouth 2 (two) times daily. 06/07/23   [provider]  Oxycodone  HCl  10 MG TABS Take 10 mg by mouth 3 (three) times daily.     [provider]  pantoprazole  (PROTONIX ) 40 MG tablet Take 40 mg by mouth daily as needed (heartburn).  02/20/15   [provider]  propranolol (INDERAL) 10 MG tablet SMARTSIG:0.5-1 Tablet(s) By Mouth 1-3 Times Daily PRN 12/16/21   [provider]  REXULTI  2 MG TABS Take 2 mg by mouth daily.  11/22/18   [provider]  UBRELVY  100 MG TABS Take 1 tablet (100 mg total) by mouth daily as needed. Take 1 tablet at onset of headache, may repeat in 2 hours if needed. Max is 200 mg in 24 hours. 08/02/23   Gayland Lauraine PARAS, NP   XTAMPZA  ER 18 MG C12A Take 1 capsule by mouth 2 (two) times daily. 04/25/20   [provider]    Family History Family History  Problem Relation Age of Onset   Cancer Mother    Dementia Mother    Diabetes Father    Migraines Sister    Fibroids Daughter     Social History Social History   Tobacco Use   Smoking status: Every Day    Current packs/day: 0.25    Average packs/day: 0.3 packs/day for 15.0 years (3.8 ttl pk-yrs)    Types: Cigarettes    Passive exposure: Never   Smokeless tobacco: Never  Vaping Use   Vaping status: Former   Start date: 06/10/2019   Substances: Nicotine, CBD, Flavoring  Substance Use Topics   Alcohol use: Not Currently    Alcohol/week: 1.0 standard drink of alcohol    Types: 1 Standard drinks or equivalent per week    Comment: rare   Drug use: No     Allergies   Sulfa antibiotics and Ibuprofen   Review of Systems Review of Systems   Physical Exam Triage Vital Signs ED Triage Vitals  Encounter Vitals Group     BP 08/02/24 0906 113/76     Girls Systolic BP Percentile --      Girls Diastolic BP Percentile --      Boys Systolic BP Percentile --      Boys Diastolic BP Percentile --      Pulse Rate 08/02/24 0906 (!) 104     Resp 08/02/24 0906 (!) 26     Temp 08/02/24 0906 98.2 F (36.8 C)     Temp src --      SpO2 08/02/24 0906 (!) 89 %     Weight --      Height --      Head Circumference --      Peak Flow --      Pain Score 08/02/24 0909 1     Pain Loc --      Pain Education --      Exclude from Growth Chart --    No data found.  Updated Vital Signs BP 113/76   Pulse (!) 104   Temp 98.2 F (36.8 C)   Resp (!) 26   SpO2 94%   Visual Acuity Right Eye Distance:   Left Eye Distance:   Bilateral Distance:    Right Eye Near:   Left Eye Near:    Bilateral Near:     Physical Exam   UC Treatments / Results  Labs (all labs ordered are listed, but only abnormal results are displayed) Labs Reviewed  POC SARS  CORONAVIRUS 2 AG -  ED  POCT INFLUENZA A/B    EKG   Radiology  DG Chest 2 View Result Date: 08/02/2024 CLINICAL DATA:  Upper respiratory infection, hypoxia. EXAM: CHEST - 2 VIEW COMPARISON:  06/15/2007 and CT chest from 02/27/2023 FINDINGS: Reverse right shoulder arthroplasty. Left lower lobe retrodiaphragmatic airspace opacity potentially from pneumonia or atelectasis, better appreciated on the lateral projection. No blunting of the costophrenic angles. IMPRESSION: 1. Left lower lobe retrodiaphragmatic airspace opacity potentially from pneumonia or atelectasis. Electronically Signed   By: Ryan Salvage M.D.   On: 08/02/2024 10:22    Procedures Procedures (including critical care time)  Medications Ordered in UC Medications  ipratropium-albuterol  (DUONEB) 0.5-2.5 (3) MG/3ML nebulizer solution 3 mL (3 mLs Nebulization Given 08/02/24 0933)    Initial Impression / Assessment and Plan / UC Course  I have reviewed the triage vital signs and the nursing notes.  Pertinent labs & imaging results that were available during my care of the patient were reviewed by me and considered in my medical decision making (see chart for details).    Patient unable to maintain sPO2 above 90% after Nebulizer treatment (89% on repeat).  Recommend hospital admission.  Patient declines EMS transport.  Signed out AMA.  Patient to have daughter transport her to hospital.  Final Clinical Impressions(s) / UC Diagnoses   Final diagnoses:  Upper respiratory tract infection, unspecified type  Community acquired pneumonia of left lower lobe of lung  Hypoxia   Discharge Instructions   None    ED Prescriptions   None    PDMP not reviewed this encounter.

## 2024-08-03 ENCOUNTER — Telehealth: Payer: Self-pay

## 2024-08-03 NOTE — Telephone Encounter (Signed)
 Called to check on patient. Currently admitted on oxygen. Feels okay with the oxygen.

## 2024-08-07 ENCOUNTER — Telehealth (INDEPENDENT_AMBULATORY_CARE_PROVIDER_SITE_OTHER): Payer: BC Managed Care – PPO | Admitting: Neurology

## 2024-08-07 DIAGNOSIS — G43709 Chronic migraine without aura, not intractable, without status migrainosus: Secondary | ICD-10-CM

## 2024-08-07 MED ORDER — UBRELVY 100 MG PO TABS: 1.0000 | ORAL_TABLET | Freq: Every day | ORAL | 11 refills | Status: AC | PRN

## 2024-08-07 MED ORDER — AIMOVIG 140 MG/ML ~~LOC~~ SOAJ: 140.0000 mg | SUBCUTANEOUS | 11 refills | Status: AC

## 2024-08-07 NOTE — Progress Notes (Signed)
 PATIENT: Nicole Bartlett DOB: 01-Nov-1966  REASON FOR VISIT: follow up for migraines  HISTORY FROM: patient PRIMARY NEUROLOGIST: Onita  Virtual Visit via Video Note  I connected with Nicole Bartlett on 08/07/24 at  1:45 PM EDT by a video enabled telemedicine application and verified that I am speaking with the correct person using two identifiers.  Location: Patient: at her home Provider: in the office    I discussed the limitations of evaluation and management by telemedicine and the availability of in person appointments. The patient expressed understanding and agreed to proceed.  HISTORY OF PRESENT ILLNESS: Today 08/07/24  08/07/24 SS: Via VV, at home recovering from PNA. Migraines doing really well. Remains on Aimovig  140 mg monthly injection, takes Ubrelvy  PRN. Only 1 migraine a month. For milder headaches will take Tylenol .   08/02/23 SS: Remains on Aimovig  140 mg, on average 2 migraines in 30 days. Her pain management filled her Aimovig , thinks they had to get PA. 5 mild to moderate headaches. Uses Tylenol . Stopped the Maxalt , uses ubrelvy  with good benefit from pain management. Few more headaches due to weather change, stress, end of year taxes at [place]. Is off Depakote  (due to weight gain) for mood, switched to Trileptal for mental health.   Update 07/28/22 SS: Nicole Bartlett is here today for follow-up. Remains on Aimovig  140 mg, no side effect. Has about 2-3 migraines a month. Maxalt  usually helps, if it doesn't excedrin migraine is a back up. No health issues. Seeing psychiatry. Still working at [employer], her granddaughter just started there.  No new issues or concerns.  Update 04/20/21 SS: Nicole Bartlett is a 58 year old female with history of headaches.  Doing well on Aimovig  140 mg monthly injection, denies side effect.  Is on Depakote  from her psychiatrist.  Takes Maxalt  with good benefit as needed.  Has OSA, but does not use BiPAP.  On average, reports 2 migraines a month, will start with  Tylenol , if not helpful will proceed with Maxalt , usually only takes Maxalt  once a month.  Had right Achilles tendon repair in December 2021.  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  Overall health has been good.  Here today for evaluation unaccompanied.  Is pleased with her headache control.  Update 04/16/2020 SS: Nicole Bartlett is a 58 year old female with history of headaches and OSA, does not use BiPAP.  She remains on Aimovig  140 mg monthly injection, she has come off Topamax .  Headaches remain well controlled.  She may have 2 migraines a month, does have fairly frequent  regular headache easily taken care of with Tylenol .  Sees her psychiatrist monthly, is on Depakote .  Migraines respond well to Maxalt , usually takes twice a month with good benefit.  Denies side effects of medications.  She works as an Airline pilot.  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  She works as an Airline pilot at [employer]. Having some right Achilles tendon issues.  Presents today for evaluation unaccompanied.  HISTORY  08/13/2019 SS: Nicole Bartlett is a 58 year old female with history of headaches and obstructive sleep apnea.  She does not use her BiPAP.  She remains on Topamax  25 mg in the morning, 50 mg in the evening. Higher doses have caused her to have difficulty focusing. She is also taking Depakote , prescribed by her psychiatrist.  She takes Maxalt  as needed for headache. She had right shoulder replacement surgery in August 2020.  She is also taking Aimovig  prescribed by her psychiatrist for migraines.  She reports her headaches are doing well.  She received 3-4 Aimovig  injections, but missed the injection last month.  She has remained on Topamax .  She says with Aimovig  she has had an 80% reduction in headaches.  She has not had to take Maxalt  in 2 months.  She reports her headaches are triggered by sun sensitivity, driving at night when the sun is going down.  She indicates her health has been good.  She  works full-time as an Airline pilot at [employer].  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  Her psychiatrist is at the [facility]. She does have tremor to bilateral hands and head titubation for several years. She reports family history of same.  She has a follow-up unaccompanied.  REVIEW OF SYSTEMS: Out of a complete 14 system review of symptoms, the patient complains only of the following symptoms, and all other reviewed systems are negative.  See HPI  ALLERGIES: Allergies  Allergen Reactions   Sulfa Antibiotics Itching and Rash    angioedema    Ibuprofen Other (See Comments)    Hx of stomach ulcers    HOME MEDICATIONS: Outpatient Medications Prior to Visit  Medication Sig Dispense Refill   buPROPion  (WELLBUTRIN  XL) 300 MG 24 hr tablet Take 300 mg by mouth daily.     busPIRone (BUSPAR) 10 MG tablet Take 10 mg by mouth 3 (three) times daily as needed.     diclofenac  Sodium (VOLTAREN ) 1 % GEL Apply 2 g topically 4 (four) times daily as needed.     doxepin  (SINEQUAN ) 50 MG capsule Take 50 mg by mouth at bedtime.     ketoconazole  (NIZORAL ) 2 % cream APPLY TWICE DAILY TO AFFECTED SKIN FOLDS AS NEEDED FOR RASH 60 g 2   methocarbamol  (ROBAXIN ) 500 MG tablet Take 500 mg by mouth 4 (four) times daily.      OXcarbazepine (TRILEPTAL) 150 MG tablet Take 150 mg by mouth 2 (two) times daily.     Oxycodone  HCl 10 MG TABS Take 10 mg by mouth 3 (three) times daily.      pantoprazole  (PROTONIX ) 40 MG tablet Take 40 mg by mouth daily as needed (heartburn).   5   propranolol (INDERAL) 10 MG tablet SMARTSIG:0.5-1 Tablet(s) By Mouth 1-3 Times Daily PRN     REXULTI  2 MG TABS Take 2 mg by mouth daily.      XTAMPZA  ER 18 MG C12A Take 1 capsule by mouth 2 (two) times daily.     Erenumab -aooe (AIMOVIG ) 140 MG/ML SOAJ Inject 140 mg into the skin every 28 (twenty-eight) days. 1 mL 11   UBRELVY  100 MG TABS Take 1 tablet (100 mg total) by mouth daily as needed. Take 1 tablet at onset of headache, may  repeat in 2 hours if needed. Max is 200 mg in 24 hours. 12 tablet 11   No facility-administered medications prior to visit.    PAST MEDICAL HISTORY: Past Medical History:  Diagnosis Date   Anxiety and depression    Chronic neck pain    Fibromyalgia    GERD (gastroesophageal reflux disease)    Headache    migraines   Narcotic dependence (HCC)    Sleep apnea    Tremor     PAST SURGICAL HISTORY: Past Surgical History:  Procedure Laterality Date   C5/C6 fusion  2010   C5/C7 fusion  2002   CESAREAN SECTION     times 2   GALLBLADDER SURGERY     REVERSE SHOULDER ARTHROPLASTY  Right 06/28/2019   Procedure: REVERSE SHOULDER ARTHROPLASTY;  Surgeon: Kay Kemps, MD;  Location: [facility];  Service: Orthopedics;  Laterality: Right;  with interscalene block   ROTATOR CUFF REPAIR Bilateral     FAMILY HISTORY: Family History  Problem Relation Age of Onset   Cancer Mother    Dementia Mother    Diabetes Father    Migraines Sister    Fibroids Daughter     SOCIAL HISTORY: Social History   Socioeconomic History   Marital status: Divorced    Spouse name: Not on file   Number of children: 2   Years of education: Not on file   Highest education level: Bachelor's degree (e.g., BA, AB, BS)  Occupational History   Occupation: Social worker: [employer]  Tobacco Use   Smoking status: Every Day    Current packs/day: 0.25    Average packs/day: 0.3 packs/day for 15.0 years (3.8 ttl pk-yrs)    Types: Cigarettes    Passive exposure: Never   Smokeless tobacco: Never  Vaping Use   Vaping status: Former   Start date: 06/10/2019   Substances: Nicotine, CBD, Flavoring  Substance and Sexual Activity   Alcohol use: Not Currently    Alcohol/week: 1.0 standard drink of alcohol    Types: 1 Standard drinks or equivalent per week    Comment: rare   Drug use: No   Sexual activity: Not on file  Other Topics Concern   Not on file  Social History Narrative   Patient is separated.    Patient has 2 children   Patient lives at home with daughter.   Patient drinks caffeine everyday until 5pm.         Social Drivers of Health   Financial Resource Strain: Low Risk  (07/14/2024)   Received from [facility]   Overall Financial Resource Strain (CARDIA)    How hard is it for you to pay for the very basics like food, housing, medical care, and heating?: Not hard at all  Food Insecurity: No Food Insecurity (08/02/2024)   Received from [facility]   Hunger Vital Sign    Within the past 12 months, you worried that your food would run out before you got the money to buy more.: Never true    Within the past 12 months, the food you bought just didn't last and you didn't have money to get more.: Never true  Recent Concern: Food Insecurity - Food Insecurity Present (07/14/2024)   Received from [facility]   Hunger Vital Sign    Within the past 12 months, you worried that your food would run out before you got the money to buy more.: Sometimes true    Within the past 12 months, the food you bought just didn't last and you didn't have money to get more.: Never true  Transportation Needs: No Transportation Needs (08/02/2024)   Received from [facility] - Transportation    In the past 12 months, has lack of transportation kept you from medical appointments or from getting medications?: No    In the past 12 months, has lack of transportation kept you from meetings, work, or from getting things needed for daily living?: No  Physical Activity: Inactive (07/14/2024)   Received from [facility]   Exercise Vital Sign    On average, how many days per week do you engage in moderate to strenuous exercise (like a brisk walk)?: 1 day    On average, how many  minutes do you engage in exercise at this level?: 0 min  Stress: No Stress Concern Present (08/02/2024)   Received from [facility] - Occupational Stress Questionnaire    Do you  feel stress - tense, restless, nervous, or anxious, or unable to sleep at night because your mind is troubled all the time - these days?: Not at all  Social Connections: Somewhat Isolated (07/14/2024)   Received from [facility]   Social Network    How would you rate your social network (family, work, friends)?: Restricted participation with some degree of social isolation  Intimate Partner Violence: Not At Risk (08/02/2024)   Received from [facility]   HITS    Over the last 12 months how often did your partner physically hurt you?: Never    Over the last 12 months how often did your partner insult you or talk down to you?: Never    Over the last 12 months how often did your partner threaten you with physical harm?: Never    Over the last 12 months how often did your partner scream or curse at you?: Never   PHYSICAL EXAM  There were no vitals filed for this visit.   There is no height or weight on file to calculate BMI.  Generalized: Well developed, in no acute distress  VV  DIAGNOSTIC DATA (LABS, IMAGING, TESTING) - I reviewed patient records, labs, notes, testing and imaging myself where available.  Lab Results  Component Value Date   WBC 5.7 06/26/2019   HGB 10.4 (L) 06/29/2019   HCT 34.0 (L) 06/29/2019   MCV 95.7 06/26/2019   PLT 233 06/26/2019      Component Value Date/Time   NA 135 06/29/2019 0305   K 4.2 06/29/2019 0305   CL 104 06/29/2019 0305   CO2 22 06/29/2019 0305   GLUCOSE 153 (H) 06/29/2019 0305   BUN 10 06/29/2019 0305   CREATININE 0.66 06/29/2019 0305   CALCIUM 8.2 (L) 06/29/2019 0305   GFRNONAA >60 06/29/2019 0305   GFRAA >60 06/29/2019 0305   No results found for: CHOL, HDL, LDLCALC, LDLDIRECT, TRIG, CHOLHDL No results found for: YHAJ8R No results found for: VITAMINB12 No results found for: TSH  ASSESSMENT AND PLAN 58 y.o. year old female  has a past medical history of Anxiety and depression, Chronic neck pain, Fibromyalgia,  GERD (gastroesophageal reflux disease), Headache, Narcotic dependence (HCC), Sleep apnea, and Tremor. here with:  1.  Chronic migraine headache  -Doing very well, about 1 migraine a month -Continue Aimovig  140 mg monthly for migraine prevention -Continue Ubrelvy  100 mg as needed for acute headache -Previously tried and failed: Topamax , Maxalt , Depakote , propranolol; currently taking: Wellbutrin , BuSpar, Rexulti , doxepin , Trileptal, oxycodone  -Next steps: Ajovy or Emgality, Qulipta -Follow-up with me in 1 year or sooner if needed virtually  Lauraine Born, AGNP-C, DNP 08/07/2024, 1:48 PM [facility] [address], [address] [place], [place] [address] [phone]

## 2024-08-07 NOTE — Patient Instructions (Signed)
 Great to see you today! Continue current medications for migraine management I hope you feel better! Follow-up in 1 year.  Thanks!!

## 2025-08-13 ENCOUNTER — Telehealth: Admitting: Neurology
